# Patient Record
Sex: Male | Born: 1935 | Race: White | Hispanic: No | Smoking: Never smoker
Health system: Southern US, Community
[De-identification: ages and names within clinical notes are randomized; demographics above are authoritative.]

---

## 2007-04-29 ENCOUNTER — Emergency Department: Payer: Self-pay | Admitting: Emergency Medicine

## 2007-10-28 ENCOUNTER — Ambulatory Visit: Payer: Self-pay | Admitting: Unknown Physician Specialty

## 2007-11-05 ENCOUNTER — Ambulatory Visit: Payer: Self-pay | Admitting: Internal Medicine

## 2008-03-29 ENCOUNTER — Ambulatory Visit: Payer: Self-pay | Admitting: Unknown Physician Specialty

## 2008-07-31 ENCOUNTER — Ambulatory Visit: Payer: Self-pay | Admitting: Internal Medicine

## 2008-07-31 ENCOUNTER — Emergency Department: Payer: Self-pay | Admitting: Emergency Medicine

## 2008-08-08 ENCOUNTER — Encounter: Payer: Self-pay | Admitting: Internal Medicine

## 2008-08-31 ENCOUNTER — Encounter: Payer: Self-pay | Admitting: Internal Medicine

## 2014-08-30 ENCOUNTER — Emergency Department
Admission: EM | Admit: 2014-08-30 | Discharge: 2014-08-30 | Disposition: A | Discharge status: Home / Self Care | Payer: Medicare Other | Attending: Emergency Medicine | Admitting: Emergency Medicine

## 2014-08-30 ENCOUNTER — Encounter: Payer: Self-pay | Admitting: Medical Oncology

## 2014-08-30 ENCOUNTER — Emergency Department: Payer: Medicare Other

## 2014-08-30 DIAGNOSIS — Y939 Activity, unspecified: Secondary | ICD-10-CM | POA: Insufficient documentation

## 2014-08-30 DIAGNOSIS — S0083XA Contusion of other part of head, initial encounter: Secondary | ICD-10-CM

## 2014-08-30 DIAGNOSIS — W172XXA Fall into hole, initial encounter: Secondary | ICD-10-CM | POA: Diagnosis not present

## 2014-08-30 DIAGNOSIS — S0990XA Unspecified injury of head, initial encounter: Secondary | ICD-10-CM | POA: Diagnosis present

## 2014-08-30 DIAGNOSIS — Y998 Other external cause status: Secondary | ICD-10-CM | POA: Insufficient documentation

## 2014-08-30 DIAGNOSIS — S0031XA Abrasion of nose, initial encounter: Secondary | ICD-10-CM

## 2014-08-30 DIAGNOSIS — Y92009 Unspecified place in unspecified non-institutional (private) residence as the place of occurrence of the external cause: Secondary | ICD-10-CM | POA: Diagnosis not present

## 2014-08-30 DIAGNOSIS — S0181XA Laceration without foreign body of other part of head, initial encounter: Secondary | ICD-10-CM | POA: Diagnosis not present

## 2014-08-30 MED ORDER — TRAMADOL HCL 50 MG PO TABS: 50.0000 mg | ORAL_TABLET | Freq: Two times a day (BID) | ORAL | Status: DC

## 2014-08-30 MED ORDER — TRAMADOL HCL 50 MG PO TABS
50.0000 mg | ORAL_TABLET | Freq: Once | ORAL | Status: AC
  Administered 2014-08-30: 50 mg via ORAL

## 2014-08-30 MED ORDER — TRAMADOL HCL 50 MG PO TABS
ORAL_TABLET | ORAL | Status: AC
  Filled 2014-08-30: qty 1
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
## 2014-08-30 NOTE — ED Notes (Signed)
Pt from home via ems with reports of tripping in a hole on the gravel at home. Pt denies LOC, denies use of blood thinner. Alert and oriented, NAD noted. Pt has hematoma to forehead and lac below eye. Bleeding controlled.

## 2014-09-13 ENCOUNTER — Other Ambulatory Visit: Payer: Self-pay | Admitting: Internal Medicine

## 2014-09-13 DIAGNOSIS — R410 Disorientation, unspecified: Secondary | ICD-10-CM

## 2014-09-13 DIAGNOSIS — R270 Ataxia, unspecified: Secondary | ICD-10-CM

## 2014-09-14 ENCOUNTER — Ambulatory Visit: Admission: RE | Admit: 2014-09-14 | Payer: Medicare Other | Source: Ambulatory Visit

## 2014-09-15 ENCOUNTER — Ambulatory Visit
Admission: RE | Admit: 2014-09-15 | Discharge: 2014-09-15 | Disposition: A | Discharge status: Home / Self Care | Payer: Medicare Other | Source: Ambulatory Visit | Attending: Internal Medicine | Admitting: Internal Medicine

## 2014-09-15 DIAGNOSIS — D181 Lymphangioma, any site: Secondary | ICD-10-CM | POA: Diagnosis not present

## 2014-09-15 DIAGNOSIS — R270 Ataxia, unspecified: Secondary | ICD-10-CM

## 2014-09-15 DIAGNOSIS — R410 Disorientation, unspecified: Secondary | ICD-10-CM | POA: Diagnosis present

## 2015-10-31 ENCOUNTER — Encounter: Payer: Self-pay | Admitting: Emergency Medicine

## 2015-10-31 ENCOUNTER — Emergency Department
Admission: EM | Admit: 2015-10-31 | Discharge: 2015-10-31 | Disposition: A | Discharge status: Home / Self Care | Payer: No Typology Code available for payment source | Attending: Emergency Medicine | Admitting: Emergency Medicine

## 2015-10-31 DIAGNOSIS — Y92410 Unspecified street and highway as the place of occurrence of the external cause: Secondary | ICD-10-CM | POA: Insufficient documentation

## 2015-10-31 DIAGNOSIS — S51812A Laceration without foreign body of left forearm, initial encounter: Secondary | ICD-10-CM | POA: Diagnosis not present

## 2015-10-31 DIAGNOSIS — Z23 Encounter for immunization: Secondary | ICD-10-CM | POA: Insufficient documentation

## 2015-10-31 DIAGNOSIS — Y999 Unspecified external cause status: Secondary | ICD-10-CM | POA: Diagnosis not present

## 2015-10-31 DIAGNOSIS — Y9389 Activity, other specified: Secondary | ICD-10-CM | POA: Diagnosis not present

## 2015-10-31 DIAGNOSIS — S59912A Unspecified injury of left forearm, initial encounter: Secondary | ICD-10-CM | POA: Diagnosis present

## 2015-10-31 MED ORDER — TETANUS-DIPHTH-ACELL PERTUSSIS 5-2.5-18.5 LF-MCG/0.5 IM SUSP
0.5000 mL | Freq: Once | INTRAMUSCULAR | Status: AC
  Administered 2015-10-31: 0.5 mL via INTRAMUSCULAR
  Filled 2015-10-31: qty 0.5
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
## 2016-03-23 IMAGING — CT CT HEAD W/O CM
3 series · 16 of 30 positions shown, 17 images · non-contrast
Comparison: August 30, 2014

CLINICAL DATA: Increased confusion following fall 2.5 weeks prior

EXAM:
CT HEAD WITHOUT CONTRAST
TECHNIQUE: Contiguous axial images were obtained from the base of the skull
through the vertex without intravenous contrast.

[Series 2: soft tissue · axial · 0.44mm/px · z∈[+1286,+1376]mm · 4 of 31 slices shown, 5 images]
[im 7/31  brain]
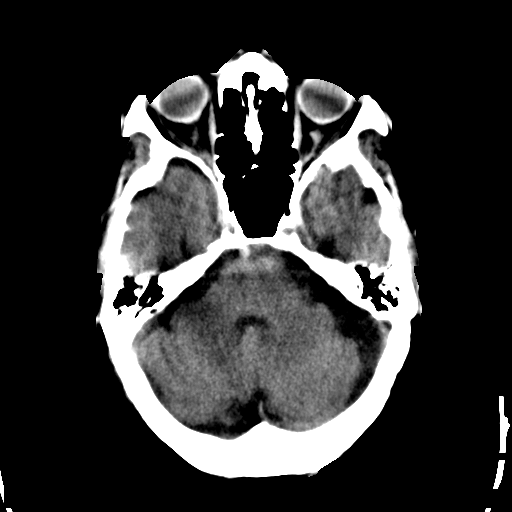
[im 7/31  bone]
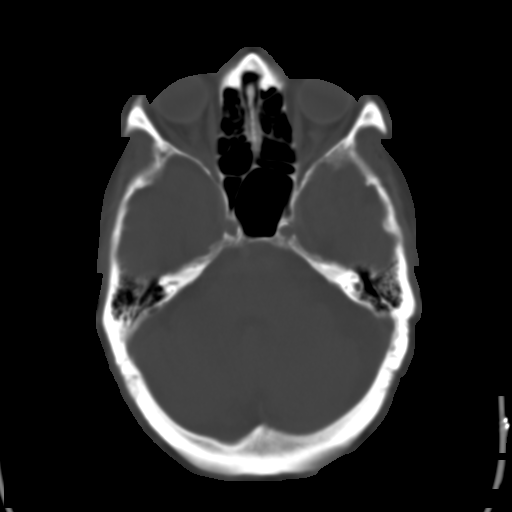
[im 13/31  brain]
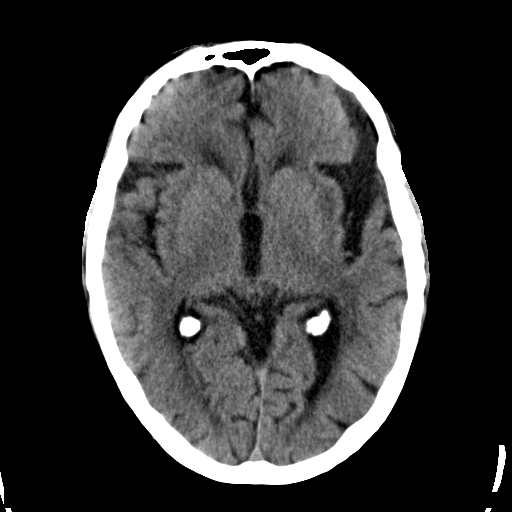
[im 19/31  brain]
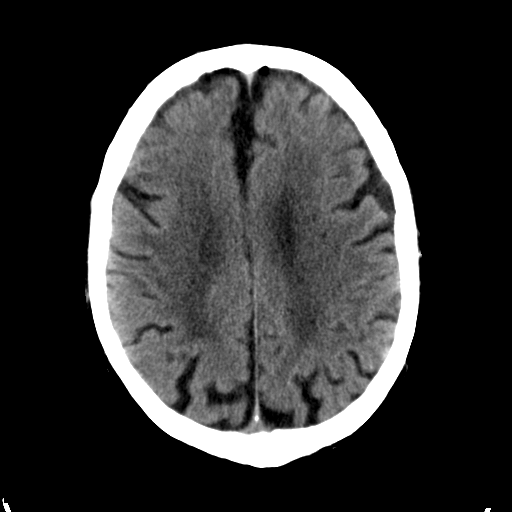
[im 25/31  brain]
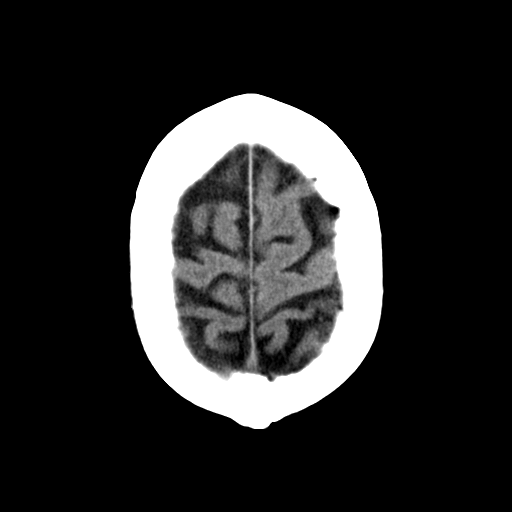

[Series 3: bone · axial · 0.44mm/px · z∈[+1265,+1405]mm · 8 of 87 slices shown]
[im 11/87  bone]
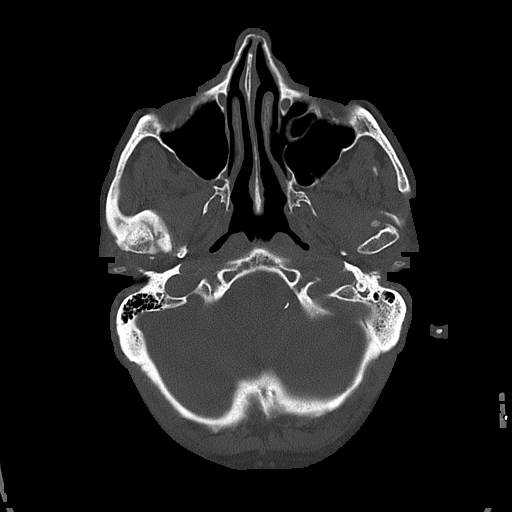
[im 21/87  bone]
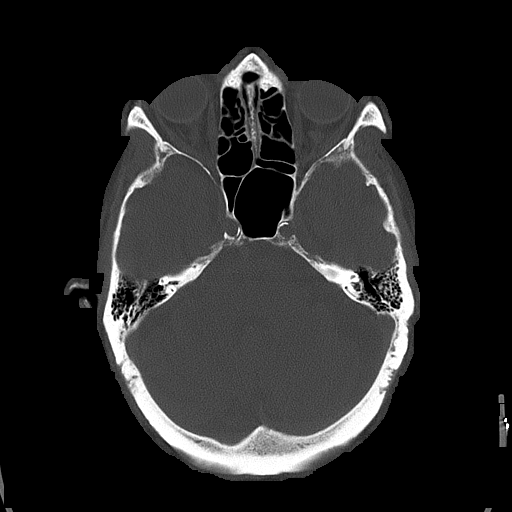
[im 31/87  bone]
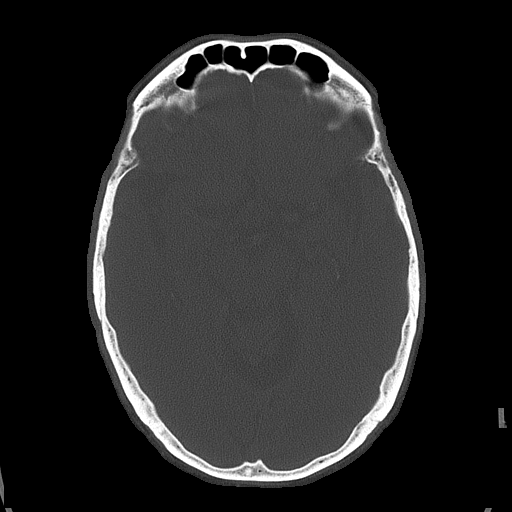
[im 41/87  bone]
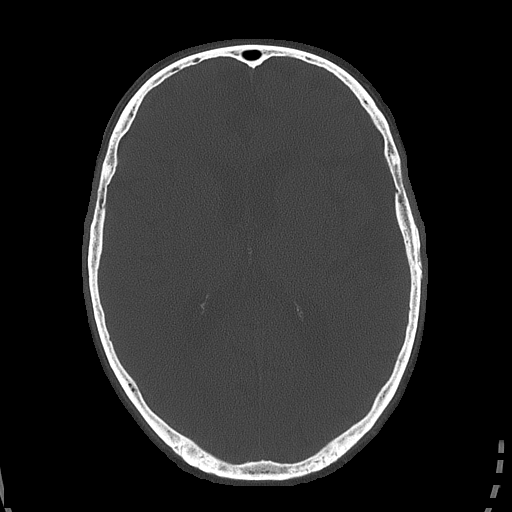
[im 51/87  bone]
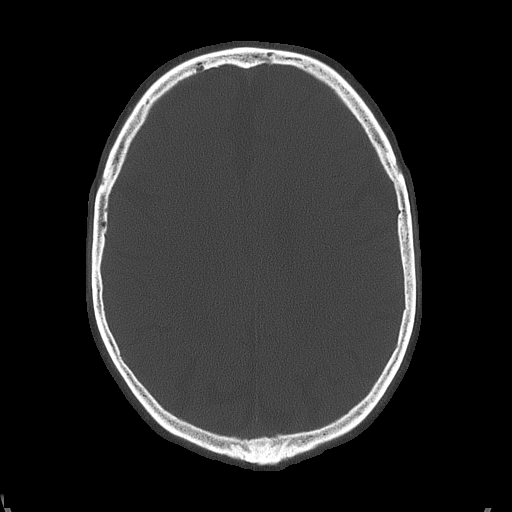
[im 61/87  bone]
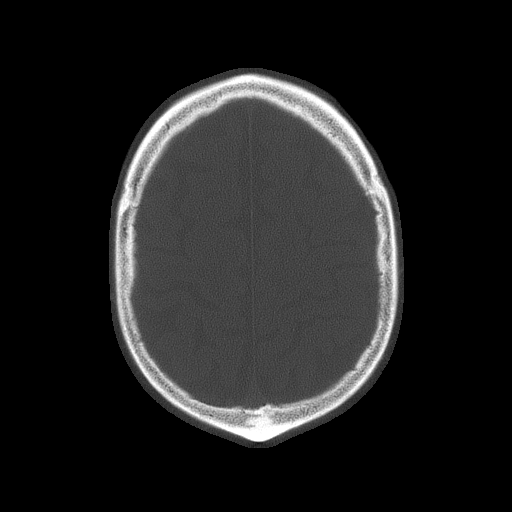
[im 71/87  bone]
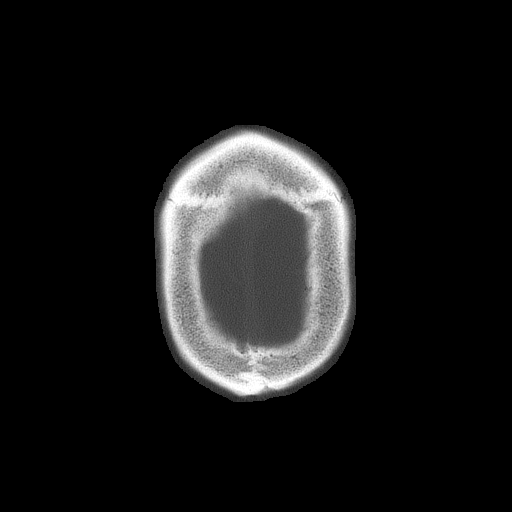
[im 81/87  bone]
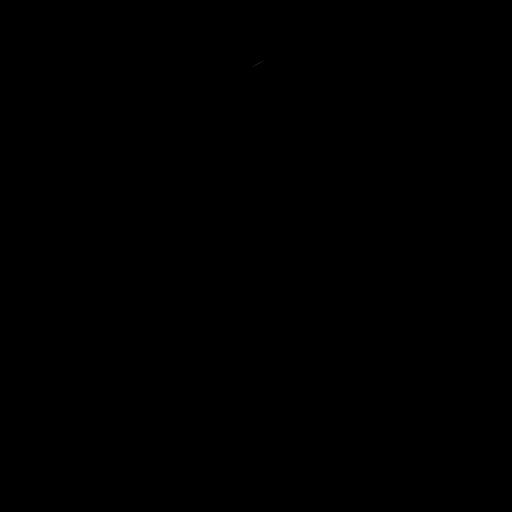

[Series 4: soft tissue recon · axial · 0.42mm/px · z∈[+1319,+1406]mm · 4 of 31 slices shown]
[im 7/31  brain]
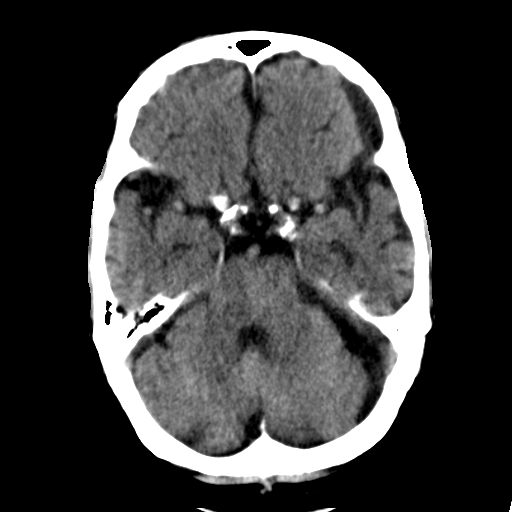
[im 13/31  brain]
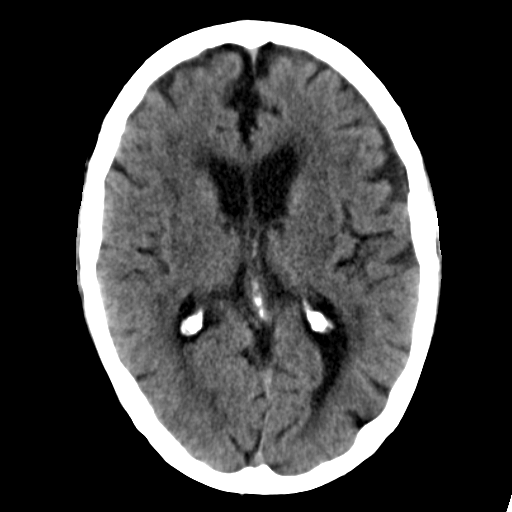
[im 19/31  brain]
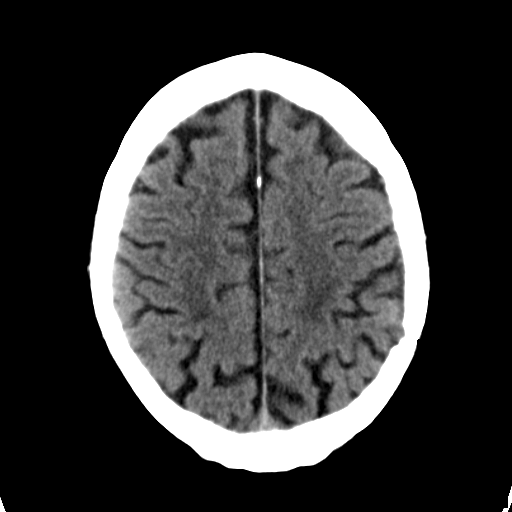
[im 25/31  brain]
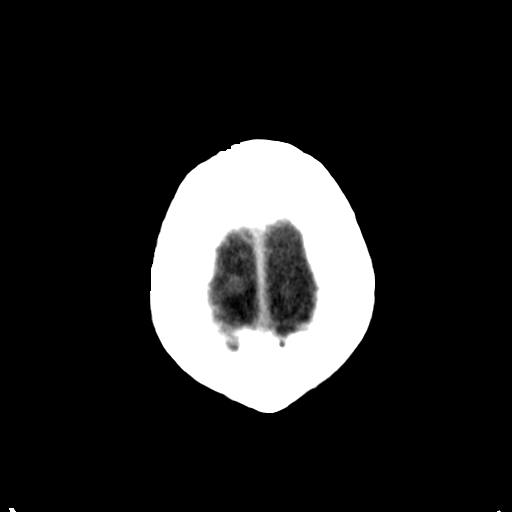

[16 of 30 positions shown; findings below may reference images not displayed]

FINDINGS: There is mild diffuse atrophy, stable. There is evidence of a
chronic subdural hygroma on the left involving the left frontal lobe
extending to the left sylvian fissure and anterior left temporal
lobe, stable. This subdural hygroma has a maximum thickness of 8 mm,
stable. There is localized impression on the brain parenchyma in
this area, stable. There is no edema or new mass effect. There is no
midline shift. There is no acute hemorrhage or evidence of mass on
this study. There is no new extra-axial fluid. There is patchy small
vessel disease throughout the centra semiovale bilaterally. Small
vessel disease is noted throughout the external capsules
bilaterally. There is no acute appearing infarct. No new gray-white
compartment lesions. There are burr holes on the left, stable. Bony
calvarium otherwise appears intact. Mastoid air cells are clear. The
right frontal scalp hematoma is considerably smaller.
IMPRESSION: Stable subdural hygroma on the left causing localized impression on
the underlying left frontal and anterior left temporal lobe regions
but no new mass effect or midline shift. No acute hemorrhage is
noted on this study. No new extra-axial fluid. There is mild atrophy
with extensive periventricular small vessel disease. There is also
small vessel disease in each external capsule. No acute infarct
apparent. The overall appearance is stable compared to recent prior
study, except that the right frontal scalp hematoma has become
considerably smaller since prior study.

## 2016-04-09 ENCOUNTER — Encounter: Payer: Self-pay | Admitting: Podiatry

## 2016-04-09 ENCOUNTER — Ambulatory Visit (INDEPENDENT_AMBULATORY_CARE_PROVIDER_SITE_OTHER): Payer: Medicare Other | Admitting: Podiatry

## 2016-04-09 VITALS — Resp 16

## 2016-04-09 DIAGNOSIS — M79676 Pain in unspecified toe(s): Secondary | ICD-10-CM | POA: Diagnosis not present

## 2016-04-09 DIAGNOSIS — B351 Tinea unguium: Secondary | ICD-10-CM

## 2016-04-09 DIAGNOSIS — Q828 Other specified congenital malformations of skin: Secondary | ICD-10-CM

## 2016-04-09 NOTE — Progress Notes (Signed)
   Subjective:    Patient ID: Danny James, male    DOB: [birth year], 83 y.o.   MRN: [id number]  HPI: He presents today with chief complaint of thick painful fungal nails and also painful lesion to the distal aspect of the third digit of the left foot. He says been like this for many years utilizes pads between his toes because of the severe deformities. He states that that makes them feel better. He tried cutting his nails himself particularly with a double-action nail clipper and he states that he is still unable to do it.    Review of Systems  All other systems reviewed and are negative.      Objective:   Physical Exam: Vital signs are stable alert and oriented 3. Pulses are palpable. Neurologic sensorium is intact. Deep tendon reflexes are intact muscle strength is normal. Orthopedic evaluation does demonstrate severe hammertoe deformities and hallux valgus deformities with overlapping and under lapping digits. Toenails are thick yellow dystrophic with mycotic painful palpation as well as debridement. They're grossly elongated. No ulcerations are noted. He does have distal clavus to the third digit of the left foot which does not demonstrate any signs of infection.      Assessment & Plan:  Assessment: Hammertoe deformities severe osteoarthritic changes bilateral foot hallux valgus deformities bilateral. Pain in limb secondary to onychomycosis and Salter porokeratotic lesion distal aspect third digit left foot.  Plan: Debrided all nails 1 through 5 bilaterally covered service secondary to pain to redirect hyperkeratotic tissue secondary to pain no iatrogenic lesions were identified.

## 2016-08-13 ENCOUNTER — Ambulatory Visit (INDEPENDENT_AMBULATORY_CARE_PROVIDER_SITE_OTHER): Payer: Medicare Other | Admitting: Podiatry

## 2016-08-13 ENCOUNTER — Encounter: Payer: Self-pay | Admitting: Podiatry

## 2016-08-13 DIAGNOSIS — B351 Tinea unguium: Secondary | ICD-10-CM

## 2016-08-13 DIAGNOSIS — Q828 Other specified congenital malformations of skin: Secondary | ICD-10-CM

## 2016-08-13 DIAGNOSIS — M79676 Pain in unspecified toe(s): Secondary | ICD-10-CM | POA: Diagnosis not present

## 2016-08-13 NOTE — Progress Notes (Signed)
He presents today to complaint of painful elongated toenails. Painful callus third toe left foot has been applying an acid pad.  Objective: Pulses are palpable nails are long thickened dystrophic with mycotic pulse remained palpable no open lesions or wounds. Painful lesion third digit of the left foot distal clavus. No open lesions or wounds are noted at this area.  Assessment: Pain and limps onychomycosis. Pain in limb secondary to porokeratosis and callus.  Plan: Debridement toenails. Debridement of porokeratosis.

## 2017-04-30 ENCOUNTER — Other Ambulatory Visit: Payer: Self-pay | Admitting: Internal Medicine

## 2017-04-30 DIAGNOSIS — F4489 Other dissociative and conversion disorders: Secondary | ICD-10-CM

## 2017-05-04 ENCOUNTER — Ambulatory Visit
Admission: RE | Admit: 2017-05-04 | Discharge: 2017-05-04 | Disposition: A | Discharge status: Home / Self Care | Payer: Medicare Other | Source: Ambulatory Visit | Attending: Internal Medicine | Admitting: Internal Medicine

## 2017-05-04 DIAGNOSIS — I6200 Nontraumatic subdural hemorrhage, unspecified: Secondary | ICD-10-CM | POA: Diagnosis not present

## 2017-05-04 DIAGNOSIS — F4489 Other dissociative and conversion disorders: Secondary | ICD-10-CM | POA: Insufficient documentation

## 2017-09-07 ENCOUNTER — Other Ambulatory Visit: Payer: Self-pay

## 2017-09-07 ENCOUNTER — Emergency Department: Payer: Medicare Other

## 2017-09-07 ENCOUNTER — Observation Stay
Admission: EM | Admit: 2017-09-07 | Discharge: 2017-09-11 | Disposition: A | Discharge status: Skilled Nursing Facility | Payer: Medicare Other | Attending: Internal Medicine | Admitting: Internal Medicine

## 2017-09-07 DIAGNOSIS — S41111A Laceration without foreign body of right upper arm, initial encounter: Secondary | ICD-10-CM | POA: Diagnosis not present

## 2017-09-07 DIAGNOSIS — M4854XA Collapsed vertebra, not elsewhere classified, thoracic region, initial encounter for fracture: Secondary | ICD-10-CM | POA: Diagnosis not present

## 2017-09-07 DIAGNOSIS — Y9301 Activity, walking, marching and hiking: Secondary | ICD-10-CM | POA: Diagnosis not present

## 2017-09-07 DIAGNOSIS — Z9181 History of falling: Secondary | ICD-10-CM | POA: Diagnosis not present

## 2017-09-07 DIAGNOSIS — T148XXA Other injury of unspecified body region, initial encounter: Secondary | ICD-10-CM | POA: Diagnosis present

## 2017-09-07 DIAGNOSIS — E876 Hypokalemia: Secondary | ICD-10-CM | POA: Insufficient documentation

## 2017-09-07 DIAGNOSIS — Z79899 Other long term (current) drug therapy: Secondary | ICD-10-CM | POA: Insufficient documentation

## 2017-09-07 DIAGNOSIS — Y9389 Activity, other specified: Secondary | ICD-10-CM | POA: Diagnosis not present

## 2017-09-07 DIAGNOSIS — I700 Atherosclerosis of aorta: Secondary | ICD-10-CM | POA: Insufficient documentation

## 2017-09-07 DIAGNOSIS — S0003XA Contusion of scalp, initial encounter: Secondary | ICD-10-CM | POA: Diagnosis present

## 2017-09-07 DIAGNOSIS — R159 Full incontinence of feces: Secondary | ICD-10-CM | POA: Insufficient documentation

## 2017-09-07 DIAGNOSIS — R4182 Altered mental status, unspecified: Principal | ICD-10-CM | POA: Diagnosis present

## 2017-09-07 DIAGNOSIS — R296 Repeated falls: Secondary | ICD-10-CM | POA: Insufficient documentation

## 2017-09-07 DIAGNOSIS — D649 Anemia, unspecified: Secondary | ICD-10-CM | POA: Insufficient documentation

## 2017-09-07 DIAGNOSIS — F0390 Unspecified dementia without behavioral disturbance: Secondary | ICD-10-CM | POA: Insufficient documentation

## 2017-09-07 DIAGNOSIS — W0110XA Fall on same level from slipping, tripping and stumbling with subsequent striking against unspecified object, initial encounter: Secondary | ICD-10-CM | POA: Diagnosis not present

## 2017-09-07 DIAGNOSIS — W19XXXA Unspecified fall, initial encounter: Secondary | ICD-10-CM

## 2017-09-07 DIAGNOSIS — Z86010 Personal history of colonic polyps: Secondary | ICD-10-CM | POA: Diagnosis not present

## 2017-09-07 DIAGNOSIS — R443 Hallucinations, unspecified: Secondary | ICD-10-CM | POA: Insufficient documentation

## 2017-09-07 DIAGNOSIS — R4781 Slurred speech: Secondary | ICD-10-CM | POA: Diagnosis not present

## 2017-09-07 DIAGNOSIS — F0781 Postconcussional syndrome: Secondary | ICD-10-CM | POA: Diagnosis not present

## 2017-09-07 DIAGNOSIS — N281 Cyst of kidney, acquired: Secondary | ICD-10-CM | POA: Diagnosis not present

## 2017-09-07 DIAGNOSIS — W1839XA Other fall on same level, initial encounter: Secondary | ICD-10-CM | POA: Insufficient documentation

## 2017-09-07 DIAGNOSIS — I252 Old myocardial infarction: Secondary | ICD-10-CM | POA: Diagnosis not present

## 2017-09-07 DIAGNOSIS — S51812A Laceration without foreign body of left forearm, initial encounter: Secondary | ICD-10-CM | POA: Insufficient documentation

## 2017-09-07 DIAGNOSIS — E860 Dehydration: Secondary | ICD-10-CM | POA: Diagnosis not present

## 2017-09-07 DIAGNOSIS — R531 Weakness: Secondary | ICD-10-CM | POA: Insufficient documentation

## 2017-09-07 DIAGNOSIS — Z85038 Personal history of other malignant neoplasm of large intestine: Secondary | ICD-10-CM | POA: Insufficient documentation

## 2017-09-07 LAB — CBC
HCT: 35.6 % — ABNORMAL LOW (ref 40.0–52.0)
Hemoglobin: 12.4 g/dL — ABNORMAL LOW (ref 13.0–18.0)
MCH: 32.6 pg (ref 26.0–34.0)
MCHC: 34.8 g/dL (ref 32.0–36.0)
MCV: 93.7 fL (ref 80.0–100.0)
Platelets: 181 10*3/uL (ref 150–440)
RBC: 3.8 MIL/uL — AB (ref 4.40–5.90)
RDW: 15.3 % — ABNORMAL HIGH (ref 11.5–14.5)
WBC: 6.9 10*3/uL (ref 3.8–10.6)

## 2017-09-07 LAB — COMPREHENSIVE METABOLIC PANEL
ALT: 17 U/L (ref 0–44)
AST: 33 U/L (ref 15–41)
Albumin: 3.5 g/dL (ref 3.5–5.0)
Alkaline Phosphatase: 53 U/L (ref 38–126)
Anion gap: 7 (ref 5–15)
BILIRUBIN TOTAL: 1.2 mg/dL (ref 0.3–1.2)
BUN: 19 mg/dL (ref 8–23)
CALCIUM: 8.3 mg/dL — AB (ref 8.9–10.3)
CO2: 23 mmol/L (ref 22–32)
Chloride: 110 mmol/L (ref 98–111)
Creatinine, Ser: 0.74 mg/dL (ref 0.61–1.24)
Glucose, Bld: 87 mg/dL (ref 70–99)
Potassium: 3.4 mmol/L — ABNORMAL LOW (ref 3.5–5.1)
SODIUM: 140 mmol/L (ref 135–145)
Total Protein: 6 g/dL — ABNORMAL LOW (ref 6.5–8.1)

## 2017-09-07 LAB — AMMONIA: Ammonia: 16 umol/L (ref 9–35)

## 2017-09-07 LAB — TROPONIN I: Troponin I: <0.03 ng/mL (ref <0.03)

## 2017-09-07 LAB — CK: Total CK: 473 U/L — ABNORMAL HIGH (ref 49–397)

## 2017-09-07 MED ORDER — SODIUM CHLORIDE 0.9 % IV BOLUS
1000.0000 mL | Freq: Once | INTRAVENOUS | Status: AC
  Administered 2017-09-07: 1000 mL via INTRAVENOUS

## 2017-09-07 MED ORDER — IOHEXOL 300 MG/ML  SOLN
100.0000 mL | Freq: Once | INTRAMUSCULAR | Status: AC | PRN
  Administered 2017-09-07: 100 mL via INTRAVENOUS

## 2017-09-07 MED ORDER — TETANUS-DIPHTH-ACELL PERTUSSIS 5-2.5-18.5 LF-MCG/0.5 IM SUSP
0.5000 mL | Freq: Once | INTRAMUSCULAR | Status: AC
  Administered 2017-09-07: 0.5 mL via INTRAMUSCULAR
  Filled 2017-09-07: qty 0.5

## 2017-09-07 NOTE — ED Provider Notes (Signed)
[facility] Emergency Department Provider Note  ____________________________________________  Time seen: Approximately 9:54 PM  I have reviewed the triage vital signs and the nursing notes.   HISTORY  Chief Complaint Altered Mental Status    HPI Danny James is a 84 y.o. male, not anticoagulated, presenting for altered mental status.  The patient was last seen in his usual state of health July 4.  At this time, the patient is altered and unable to give peer and he is accompanied by his son and daughter-in-law who state that they called him on the phone today and he was not answering questions appropriately.  Upon arrival, they noted a large contusion to the right forehead with dried blood and altered mental status.  The patient denies any pain.  He states that he fell 5 times yesterday because "it was dark I could not see anything" but is unable to give any additional history.  History reviewed. No pertinent past medical history.  There are no active problems to display for this patient.   History reviewed. No pertinent surgical history.    Allergies Patient has no known allergies.  No family history on file.  Social History Social History   Tobacco Use  . Smoking status: Never Smoker  . Smokeless tobacco: Never Used  Substance Use Topics  . Alcohol use: No  . Drug use: No    Review of Systems Unable to obtain due to altered mental status.  ____________________________________________   PHYSICAL EXAM:  VITAL SIGNS: ED Triage Vitals  Enc Vitals Group     BP 09/07/17 2052 122/76     Pulse Rate 09/07/17 2052 86     Resp 09/07/17 2052 18     Temp 09/07/17 2052 98.3 F (36.8 C)     Temp Source 09/07/17 2052 Oral     SpO2 09/07/17 2052 100 %     Weight 09/07/17 2045 180 lb 12.4 oz (82 kg)     Height 09/07/17 2045 6' (1.829 m)     Head Circumference --      Peak Flow --      Pain Score --      Pain Loc --      Pain Edu? --      Excl.  in [place]? --     Constitutional: The patient is alert to person and place but does not know the year or month.  GCS is 15.   Eyes: Conjunctivae are normal.  EOMI. PERRLA no scleral icterus.  No raccoon eyes. Head: The patient has a 2 x 2 inch contusion on the right scalp with overlying ecchymosis and dried blood; it is hemostatic.  No battle sign. EARS: No hemotympanum on the right; most of the TM is obscured on the left from cerumen but there is no evidence of hemotympanum from what I can see. Nose: No congestion/rhinnorhea.  No swelling over the nose or septal hematoma. Mouth/Throat: Mucous membranes are dry.  No dental injury or malocclusion..  Neck: No stridor.  Supple.  No midline C-spine tenderness to palpation, step-offs or deformities. Cardiovascular: Normal rate, regular rhythm. No murmurs, rubs or gallops.  Respiratory: Normal respiratory effort.  No accessory muscle use or retractions. Lungs CTAB.  No wheezes, rales or ronchi. Gastrointestinal: Soft, nontender and nondistended.  No guarding or rebound.  No peritoneal signs. Musculoskeletal: Pelvis is stable.  The patient has full range of motion of the bilateral hips, knees, and ankles without pain.  No LE edema. No ttp  in the calves or palpable cords.  Negative Homan's sign. Neurologic:  A&Ox2.  Speech intermittently slurred and occasionally nonsensical he does not answer questions a properly..  Face and smile are symmetric.  EOMI. pupils are 2 mm and equal bilaterally.  5 out of 5 grip strength, biceps, triceps strength, hip flexors, dorsiflexion and plantar flexion.  Normal sensation to light touch in the lower extremities and upper extremities, face. Skin:  Skin is warm, dry.  In addition to the scalp contusion, the patient also has a linear 3 inch skin tear over the distal left forearm and a 2 inch skin tear over the right arm Psychiatric: Mood and affect are normal.  ____________________________________________   LABS (all labs  ordered are listed, but only abnormal results are displayed)  Labs Reviewed  COMPREHENSIVE METABOLIC PANEL - Abnormal; Notable for the following components:      Result Value   Potassium 3.4 (*)    Calcium 8.3 (*)    Total Protein 6.0 (*)    All other components within normal limits  CBC - Abnormal; Notable for the following components:   RBC 3.80 (*)    Hemoglobin 12.4 (*)    HCT 35.6 (*)    RDW 15.3 (*)    All other components within normal limits  CK - Abnormal; Notable for the following components:   Total CK 473 (*)    All other components within normal limits  TROPONIN I  AMMONIA  CBG MONITORING, ED   ____________________________________________  EKG  ED ECG REPORT I, Eula Listen, the attending physician, personally viewed and interpreted this ECG.   Date: 09/07/2017  EKG Time: 2046  Rate: 85  Rhythm: normal sinus rhythm  Axis: normal  Intervals:none  ST&T Change: No STEMI  ____________________________________________  RADIOLOGY  Dg Chest 2 View  Result Date: 09/07/2017 CLINICAL DATA:  Altered mental status, fever and possible urinary tract infection. EXAM: CHEST - 2 VIEW COMPARISON:  CXR 07/31/2008 FINDINGS: Colonic interposition over the liver shadow with elevated right hemidiaphragm is noted. There is bibasilar subsegmental atelectasis with low lung volumes. No overt pulmonary edema. Posterior costophrenic angle pulmonary opacities likely reflect atelectatic change. Superimposed pneumonia would be difficult to entirely exclude as seen on the lateral view. No acute osseous appearing abnormality. Tortuous atherosclerotic aorta. Heart size is within normal limits. IMPRESSION: 1. Elevated right hemidiaphragm with colonic interposition over the liver shadow. 2. Bibasilar atelectasis. Superimposed pneumonia projecting over the posterior costophrenic angle is not excluded given somewhat more confluent opacity seen on the lateral view. 3. Aortic atherosclerosis  without aneurysm. Electronically Signed   By: Ashley Royalty M.D.   On: 09/07/2017 21:24    ____________________________________________   PROCEDURES  Procedure(s) performed: None  Procedures  Critical Care performed: No ____________________________________________   INITIAL IMPRESSION / ASSESSMENT AND PLAN / ED COURSE  Pertinent labs & imaging results that were available during my care of the patient were reviewed by me and considered in my medical decision making (see chart for details).  84 y.o. male brought by his family for altered mental status with obvious signs of fall.  We will do a full trauma evaluation with CT of the head and neck.  He does have some skin tears and I will give him a Tdap for that.  The cause of the patient's fall is unclear and it is not known whether he had syncopal episodes.  At this time, his EKG does not show ischemic changes or arrhythmia.  His troponin is also  negative.  His blood counts are reassuring; he is not severely anemic.  His electrolytes are reassuring and he is not in renal failure; his hepatic function panel is also normal.  UA is pending, and I have added a CK as well.  The patient's chest x-ray does show an elevated right hemidiaphragm with colonic interposition so CT of the chest and abdomen have been ordered.  There is also some bibasilar atelectasis and a better view will be obtained with a CT which will help determine the course for antibiotic administration.  Plan admission.  ----------------------------------------- 11:35 PM on 09/07/2017 -----------------------------------------  The patient is continued to be hemodynamically stable.  His electrolytes are reassuring.  His ammonia level is 16.  His CK is mildly elevated at 473 but his creatinine is reassuring; he is receiving intravenous fluids.  The patient's CT head, C-spine, chest and abdomen and pelvis are pending and have been signed out to Dr. Charlesetta Ivory for final  disposition.  ____________________________________________  FINAL CLINICAL IMPRESSION(S) / ED DIAGNOSES  Final diagnoses:  Altered mental status, unspecified altered mental status type  Fall, initial encounter  Contusion of left temporofrontal scalp, initial encounter  Multiple skin tears         NEW MEDICATIONS STARTED DURING THIS VISIT:  New Prescriptions   No medications on file      Eula Listen, MD 09/07/17 2337

## 2017-09-07 NOTE — ED Triage Notes (Addendum)
Pt arrives to ED via [facility] from home with c/o AMS, fever, and possible UTI. Per EMS, pt had an axillary temp of 100.2, was confused, and has injuries from a fall in the last few days that was untreated. Pt has a laceration to the right side of the head and bilateral skin tears on the elbows. EMS states pt meets Code Sepsis protocols; 18G PIV in the right forearm placed with 511mL NS infused PTA. Pt is alert but confused; family at bedside. 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

## 2017-09-07 NOTE — ED Notes (Signed)
Patient transported to X-ray 

## 2017-09-08 ENCOUNTER — Other Ambulatory Visit: Payer: Self-pay

## 2017-09-08 ENCOUNTER — Encounter: Payer: Self-pay | Admitting: Internal Medicine

## 2017-09-08 ENCOUNTER — Observation Stay: Payer: Medicare Other

## 2017-09-08 DIAGNOSIS — R4182 Altered mental status, unspecified: Secondary | ICD-10-CM | POA: Diagnosis not present

## 2017-09-08 LAB — URINALYSIS, COMPLETE (UACMP) WITH MICROSCOPIC
Bacteria, UA: NONE SEEN
Bilirubin Urine: NEGATIVE
GLUCOSE, UA: NEGATIVE mg/dL
HGB URINE DIPSTICK: NEGATIVE
KETONES UR: 20 mg/dL — AB
LEUKOCYTES UA: NEGATIVE
NITRITE: NEGATIVE
Protein, ur: NEGATIVE mg/dL
SQUAMOUS EPITHELIAL / LPF: NONE SEEN (ref 0–5)
Specific Gravity, Urine: 1.026 (ref 1.005–1.030)
pH: 5 (ref 5.0–8.0)

## 2017-09-08 LAB — URINE DRUG SCREEN, QUALITATIVE (ARMC ONLY)
Amphetamines, Ur Screen: NOT DETECTED
BENZODIAZEPINE, UR SCRN: NOT DETECTED
CANNABINOID 50 NG, UR ~~LOC~~: NOT DETECTED
Cocaine Metabolite,Ur ~~LOC~~: NOT DETECTED
MDMA (Ecstasy)Ur Screen: NOT DETECTED
Methadone Scn, Ur: NOT DETECTED
Opiate, Ur Screen: NOT DETECTED
PHENCYCLIDINE (PCP) UR S: NOT DETECTED
Tricyclic, Ur Screen: NOT DETECTED

## 2017-09-08 LAB — BASIC METABOLIC PANEL
Anion gap: 9 (ref 5–15)
BUN: 15 mg/dL (ref 8–23)
CO2: 22 mmol/L (ref 22–32)
Calcium: 8.1 mg/dL — ABNORMAL LOW (ref 8.9–10.3)
Chloride: 109 mmol/L (ref 98–111)
Creatinine, Ser: 0.79 mg/dL (ref 0.61–1.24)
GFR calc Af Amer: >60 mL/min (ref >60)
GLUCOSE: 80 mg/dL (ref 70–99)
Potassium: 3.3 mmol/L — ABNORMAL LOW (ref 3.5–5.1)
Sodium: 140 mmol/L (ref 135–145)

## 2017-09-08 LAB — PHOSPHORUS: Phosphorus: 3 mg/dL (ref 2.5–4.6)

## 2017-09-08 LAB — FOLATE: FOLATE: 12.3 ng/mL (ref >5.9)

## 2017-09-08 LAB — TSH: TSH: 1.365 u[IU]/mL (ref 0.350–4.500)

## 2017-09-08 LAB — VITAMIN B12: Vitamin B-12: 217 pg/mL (ref 180–914)

## 2017-09-08 LAB — MAGNESIUM: MAGNESIUM: 1.9 mg/dL (ref 1.7–2.4)

## 2017-09-08 MED ORDER — ACETAMINOPHEN 325 MG PO TABS: 650.0000 mg | ORAL_TABLET | Freq: Four times a day (QID) | ORAL | Status: DC | PRN

## 2017-09-08 MED ORDER — ONDANSETRON HCL 4 MG/2ML IJ SOLN: 4.0000 mg | Freq: Four times a day (QID) | INTRAMUSCULAR | Status: DC | PRN

## 2017-09-08 MED ORDER — ACETAMINOPHEN 650 MG RE SUPP: 650.0000 mg | Freq: Four times a day (QID) | RECTAL | Status: DC | PRN

## 2017-09-08 MED ORDER — HALOPERIDOL LACTATE 5 MG/ML IJ SOLN
2.5000 mg | Freq: Four times a day (QID) | INTRAMUSCULAR | Status: DC | PRN
  Administered 2017-09-08 (×2): 2.5 mg via INTRAVENOUS
  Filled 2017-09-08 (×3): qty 0.5

## 2017-09-08 MED ORDER — ENOXAPARIN SODIUM 40 MG/0.4ML ~~LOC~~ SOLN
40.0000 mg | SUBCUTANEOUS | Status: DC
  Administered 2017-09-08 – 2017-09-10 (×3): 40 mg via SUBCUTANEOUS
  Filled 2017-09-08 (×3): qty 0.4

## 2017-09-08 MED ORDER — POTASSIUM CHLORIDE CRYS ER 20 MEQ PO TBCR
40.0000 meq | EXTENDED_RELEASE_TABLET | Freq: Once | ORAL | Status: AC
  Administered 2017-09-08: 40 meq via ORAL
  Filled 2017-09-08: qty 2

## 2017-09-08 MED ORDER — BISACODYL 5 MG PO TBEC: 5.0000 mg | DELAYED_RELEASE_TABLET | Freq: Every day | ORAL | Status: DC | PRN

## 2017-09-08 MED ORDER — SODIUM CHLORIDE 0.9 % IV SOLN
INTRAVENOUS | Status: AC
  Administered 2017-09-08 – 2017-09-09 (×2): via INTRAVENOUS

## 2017-09-08 MED ORDER — LACTATED RINGERS IV SOLN: INTRAVENOUS | Status: AC

## 2017-09-08 MED ORDER — ONDANSETRON HCL 4 MG PO TABS: 4.0000 mg | ORAL_TABLET | Freq: Four times a day (QID) | ORAL | Status: DC | PRN

## 2017-09-08 MED ORDER — SENNOSIDES-DOCUSATE SODIUM 8.6-50 MG PO TABS: 1.0000 | ORAL_TABLET | Freq: Every evening | ORAL | Status: DC | PRN

## 2017-09-08 NOTE — Care Management Obs Status (Signed)
[facility] NOTIFICATION   Patient Details  Name: SYMEON PULEO MRN: [id number] Date of Birth: December 08, 1920   Medicare Observation Status Notification Given:  Yes    Beverly Sessions, RN 09/08/2017, 4:53 PM

## 2017-09-08 NOTE — ED Notes (Signed)
Patient transported to 225 

## 2017-09-08 NOTE — Consult Note (Signed)
Reason for Consult:AMS Referring Physician: Verdell Carmine  CC: AMS  HPI: Danny James is an 84 y.o. male who is unable to provide any history.  All history obtained from the son.   His son tells me that he saw the pt on 07/04, at which time pt was in his usual state of health. He states he spoke to pt on the phone on 07/08, and noticed pt was incoherent, "talking out of his head"; son drove over, but pt was too weak to come to door; son broke door down, found pt sitting/slouched in chair unable to get up. EMS was called at that time.  Patient has not returned to baseline mental status.   At baseline the patient lives alone. His sons visit the pt at home and do groceries weekly, as well as call him daily. They have been trying to get him to move into assisted living for the past year, but he has been resistant to doing so. They feel he is unsafe at home by himself. He has reportedly been falling, but he does not tell his family when he falls. Gets name of family members wrong at times and does have hallucinations.     Past medical history: SDH, fecal incontinence, dementia, colon cancer stage I  Surgical history: Colon polypectomy, craniotomy for SDH  Family history: Twin sons alive and well  Social History:  reports that he has never smoked. He has never used smokeless tobacco. He reports that he does not drink alcohol or use drugs.  No Known Allergies  Medications:  I have reviewed the patient's current medications. Prior to Admission:  No medications prior to admission.   Scheduled: . enoxaparin (LOVENOX) injection  40 mg Subcutaneous Q24H    ROS: History obtained from son  General ROS: negative for - chills, fatigue, fever, night sweats, weight gain or weight loss Psychological ROS:  hallucinations, memory difficulties Ophthalmic ROS: negative for - blurry vision, double vision, eye pain or loss of vision ENT ROS: HOH Allergy and Immunology ROS: negative for - hives or itchy/watery  eyes Hematological and Lymphatic ROS: negative for - bleeding problems, bruising or swollen lymph nodes Endocrine ROS: negative for - galactorrhea, hair pattern changes, polydipsia/polyuria or temperature intolerance Respiratory ROS: negative for - cough, hemoptysis, shortness of breath or wheezing Cardiovascular ROS: negative for - chest pain, dyspnea on exertion, edema or irregular heartbeat Gastrointestinal ROS: negative for - abdominal pain, diarrhea, hematemesis, nausea/vomiting or stool incontinence Genito-Urinary ROS: negative for - dysuria, hematuria, incontinence or urinary frequency/urgency Musculoskeletal ROS: negative for - joint swelling or muscular weakness Neurological ROS: as noted in HPI Dermatological ROS: negative for rash and skin lesion changes  Physical Examination: Blood pressure 121/81, pulse 74, temperature 98.7 F (37.1 C), temperature source Oral, resp. rate 15, height 6' (1.829 m), weight 82 kg (180 lb 12.4 oz), SpO2 94 %.  HEENT-  Right scalp hematoma.  Normal external eye and conjunctiva.  Normal TM's bilaterally.  Normal auditory canals and external ears. Normal external nose, mucus membranes and septum.  Normal pharynx. Cardiovascular- S1, S2 normal, pulses palpable throughout   Lungs- chest clear, no wheezing, rales, normal symmetric air entry Abdomen- soft, non-tender; bowel sounds normal; no masses,  no organomegaly Extremities- no edema Lymph-no adenopathy palpable Musculoskeletal-no joint tenderness, deformity or swelling Skin-multiple areas of bruising on the lower extremities  Neurological Examination  Mental Status: Alert.  Perseverates.  Unable to give the name of family members in the room.  Reports that a small  child has been in the room a lot.  Unable to give the date or tell where he is.  Follows simple commands but requires extensive reinforcement to follow 3 step commandsy. Speech fluent but slurred Cranial Nerves: II: Discs flat bilaterally;  Visual fields grossly normal III,IV, VI: ptosis not present, extra-ocular motions intact bilaterally V,VII: smile symmetric, facial light touch sensation normal bilaterally VIII: hearing normal bilaterally IX,X: gag reflex present XI: bilateral shoulder shrug XII: midline tongue extension Motor: Patient able t lift all extremities off the bed with the arms being stronger than the legs.   Sensory: Pinprick and light touch intact throughout, bilaterally Deep Tendon Reflexes: 2+ and symmetric with absent AJ's bilaterally Plantars: Right: upgoing   Left: upgoing Cerebellar: Normal finger-to-nose testing bilaterally.  Gait: not tested due to safety concerns    Laboratory Studies:   Basic Metabolic Panel: Recent Labs  Lab 09/07/17 2057 09/08/17 0126 09/08/17 0526  NA 140  --  140  K 3.4*  --  3.3*  CL 110  --  109  CO2 23  --  22  GLUCOSE 87  --  80  BUN 19  --  15  CREATININE 0.74  --  0.79  CALCIUM 8.3*  --  8.1*  MG  --  1.9  --   PHOS  --  3.0  --     Liver Function Tests: Recent Labs  Lab 09/07/17 2057  AST 33  ALT 17  ALKPHOS 53  BILITOT 1.2  PROT 6.0*  ALBUMIN 3.5   No results for input(s): LIPASE, AMYLASE in the last 168 hours. Recent Labs  Lab 09/07/17 2203  AMMONIA 16    CBC: Recent Labs  Lab 09/07/17 2057  WBC 6.9  HGB 12.4*  HCT 35.6*  MCV 93.7  PLT 181    Cardiac Enzymes: Recent Labs  Lab 09/07/17 2057  CKTOTAL 473*  TROPONINI <0.03    BNP: Invalid input(s): POCBNP  CBG: No results for input(s): GLUCAP in the last 168 hours.  Microbiology: No results found for this or any previous visit.  Coagulation Studies: No results for input(s): LABPROT, INR in the last 72 hours.  Urinalysis:  Recent Labs  Lab 09/08/17 0126  COLORURINE YELLOW*  LABSPEC 1.026  PHURINE 5.0  GLUCOSEU NEGATIVE  HGBUR NEGATIVE  BILIRUBINUR NEGATIVE  KETONESUR 20*  PROTEINUR NEGATIVE  NITRITE NEGATIVE  LEUKOCYTESUR NEGATIVE    Lipid Panel:   No results found for: CHOL, TRIG, HDL, CHOLHDL, VLDL, LDLCALC  HgbA1C: No results found for: HGBA1C  Urine Drug Screen:      Component Value Date/Time   LABOPIA NONE DETECTED 09/08/2017 0126   COCAINSCRNUR NONE DETECTED 09/08/2017 0126   LABBENZ NONE DETECTED 09/08/2017 0126   AMPHETMU NONE DETECTED 09/08/2017 0126   THCU NONE DETECTED 09/08/2017 0126   LABBARB (A) 09/08/2017 0126    Result not available. Reagent lot number recalled by manufacturer.    Alcohol Level: No results for input(s): ETH in the last 168 hours.  Other results: EKG: sinus rhythm at 85 bpm.  Imaging: Dg Chest 2 View  Result Date: 09/07/2017 CLINICAL DATA:  Altered mental status, fever and possible urinary tract infection. EXAM: CHEST - 2 VIEW COMPARISON:  CXR 07/31/2008 FINDINGS: Colonic interposition over the liver shadow with elevated right hemidiaphragm is noted. There is bibasilar subsegmental atelectasis with low lung volumes. No overt pulmonary edema. Posterior costophrenic angle pulmonary opacities likely reflect atelectatic change. Superimposed pneumonia would be difficult to entirely exclude as seen on  the lateral view. No acute osseous appearing abnormality. Tortuous atherosclerotic aorta. Heart size is within normal limits. IMPRESSION: 1. Elevated right hemidiaphragm with colonic interposition over the liver shadow. 2. Bibasilar atelectasis. Superimposed pneumonia projecting over the posterior costophrenic angle is not excluded given somewhat more confluent opacity seen on the lateral view. 3. Aortic atherosclerosis without aneurysm. Electronically Signed   By: Ashley Royalty M.D.   On: 09/07/2017 21:24   Ct Head Wo Contrast  Result Date: 09/08/2017 CLINICAL DATA:  Head trauma with headache EXAM: CT HEAD WITHOUT CONTRAST CT CERVICAL SPINE WITHOUT CONTRAST TECHNIQUE: Multidetector CT imaging of the head and cervical spine was performed following the standard protocol without intravenous contrast. Multiplanar CT  image reconstructions of the cervical spine were also generated. COMPARISON:  05/04/2017 head CT FINDINGS: CT HEAD FINDINGS Brain: No evidence of acute infarction, hemorrhage, hydrocephalus, or mass lesion. Subdural collection along the left frontal convexity has become CSF density. Maximal residual thickness is 11 mm along the frontal operculum with mild cortical mass effect. Stable asymmetric enlargement of CSF left lateral of the cerebellum which could be hygroma or expanded subarachnoid space. There is chronic small vessel ischemia in the cerebral white matter with confluent low-density. Chronic midline shift towards the right. Vascular: Atherosclerosis. Skull: 2 left-sided calvarial burr holes, presumably for subdural evacuation. Sinuses/Orbits: Negative CT CERVICAL SPINE FINDINGS Alignment: No traumatic malalignment. Facet mediated C7-T1 and T1-2 mild anterolisthesis. Skull base and vertebrae: Remote T1 and T3 compression fractures. No acute fracture. Soft tissues and spinal canal: No prevertebral fluid or swelling. No visible canal hematoma. Disc levels: Advanced diffuse disc and facet degeneration. Atlantooccipital degeneration with greater spurring on the right. Ligamentous thickening behind the dens with moderate spinal stenosis. Diffuse foraminal narrowing. Upper chest: No acute finding IMPRESSION: 1. No evidence of acute intracranial or cervical spine injury. 2. Remote subdural hematoma along the left cerebral convexity. 3. Chronic small vessel ischemia. 4. Advanced spinal degeneration as described. Electronically Signed   By: Monte Fantasia M.D.   On: 09/08/2017 00:18   Ct Chest W Contrast  Result Date: 09/08/2017 CLINICAL DATA:  Altered mental status and fever.  Recent fall. EXAM: CT CHEST, ABDOMEN, AND PELVIS WITH CONTRAST TECHNIQUE: Multidetector CT imaging of the chest, abdomen and pelvis was performed following the standard protocol during bolus administration of intravenous contrast. CONTRAST:   176mL OMNIPAQUE IOHEXOL 300 MG/ML  SOLN COMPARISON:  Remote abdomen/pelvis CT 11/05/2007 FINDINGS: CT CHEST FINDINGS Cardiovascular: Aortic atherosclerosis without acute aortic abnormality. Heart is normal in size. There are coronary artery calcifications. Minimal fluid in the superior pericardial recess. Mediastinum/Nodes: Borderline lower paratracheal node measures 11 mm short axis, likely reactive. No mediastinal hemorrhage or hematoma. Esophagus is decompressed. Visualized thyroid gland is normal. Lungs/Pleura: Elevated right hemidiaphragm with compressive atelectasis in the right lower and middle lobes. Peripheral subpleural reticulations throughout both lungs. No confluent consolidation. No pulmonary edema or pleural fluid. Musculoskeletal: Posterior right eighth, ninth, and tenth rib fractures have some surrounding but incomplete callus formation. Remote mild compression fractures of T1 and T3. No acute fractures. CT ABDOMEN PELVIS FINDINGS Hepatobiliary: Small subcentimeter hypodensities in the right hepatic lobe are too small to accurately characterize. No evidence of hepatic injury. Punctate granuloma in the left lobe. No calcified gallstone or gallbladder inflammation. Pancreas: Parenchymal atrophy. No ductal dilatation or inflammation. No pancreatic injury. Spleen: Normal in size without focal abnormality. No splenic injury. Adrenals/Urinary Tract: No adrenal hemorrhage or renal injury identified. No adrenal nodule. No hydronephrosis or perinephric edema. Homogeneous renal  enhancement with symmetric excretion on delayed phase imaging. 18 mm cyst in the lower right kidney. Cortical scarring in the mid lower right kidney with punctate parenchymal calcification. Diffuse urinary bladder wall thickening with mild perivesicular stranding. Stomach/Bowel: Bowel evaluation is limited in the absence of enteric contrast. Stomach is nondistended. No small bowel wall thickening, inflammatory change or obstruction.  Cecum appears high-riding in the right upper quadrant. No cecal wall thickening. Moderate stool in the colon with colonic redundancy. Appendix not confidently identified. No mesenteric hematoma or evidence of bowel injury. Vascular/Lymphatic: Aortic and branch atherosclerosis. No acute vascular findings. No abdominopelvic adenopathy. Reproductive: Prostatic calcifications. Other: No free air or ascites. No intra-abdominal abscess. Minimal soft tissue density/fluid in the left inguinal canal. Musculoskeletal: Mild scoliosis and degenerative change throughout the lumbar spine. Prominent Schmorl's node versus mild remote superior endplate compression L1. No fracture of the lumbar spine or bony pelvis. IMPRESSION: 1. Mild urinary bladder wall thickening and perivesicular edema suspicious for cystitis. 2. Right posterior eighth, ninth, and tenth rib fractures have some surrounding callus which is incomplete, these may be subacute. 3. Additional ancillary nonacute findings as described. 4.  Aortic Atherosclerosis (ICD10-I70.0). Electronically Signed   By: Jeb Levering M.D.   On: 09/08/2017 00:42   Ct Cervical Spine Wo Contrast  Result Date: 09/08/2017 CLINICAL DATA:  Head trauma with headache EXAM: CT HEAD WITHOUT CONTRAST CT CERVICAL SPINE WITHOUT CONTRAST TECHNIQUE: Multidetector CT imaging of the head and cervical spine was performed following the standard protocol without intravenous contrast. Multiplanar CT image reconstructions of the cervical spine were also generated. COMPARISON:  05/04/2017 head CT FINDINGS: CT HEAD FINDINGS Brain: No evidence of acute infarction, hemorrhage, hydrocephalus, or mass lesion. Subdural collection along the left frontal convexity has become CSF density. Maximal residual thickness is 11 mm along the frontal operculum with mild cortical mass effect. Stable asymmetric enlargement of CSF left lateral of the cerebellum which could be hygroma or expanded subarachnoid space. There is  chronic small vessel ischemia in the cerebral white matter with confluent low-density. Chronic midline shift towards the right. Vascular: Atherosclerosis. Skull: 2 left-sided calvarial burr holes, presumably for subdural evacuation. Sinuses/Orbits: Negative CT CERVICAL SPINE FINDINGS Alignment: No traumatic malalignment. Facet mediated C7-T1 and T1-2 mild anterolisthesis. Skull base and vertebrae: Remote T1 and T3 compression fractures. No acute fracture. Soft tissues and spinal canal: No prevertebral fluid or swelling. No visible canal hematoma. Disc levels: Advanced diffuse disc and facet degeneration. Atlantooccipital degeneration with greater spurring on the right. Ligamentous thickening behind the dens with moderate spinal stenosis. Diffuse foraminal narrowing. Upper chest: No acute finding IMPRESSION: 1. No evidence of acute intracranial or cervical spine injury. 2. Remote subdural hematoma along the left cerebral convexity. 3. Chronic small vessel ischemia. 4. Advanced spinal degeneration as described. Electronically Signed   By: Monte Fantasia M.D.   On: 09/08/2017 00:18   Mr Brain Wo Contrast  Result Date: 09/08/2017 CLINICAL DATA:  Initial evaluation for acute altered mental status EXAM: MRI HEAD WITHOUT CONTRAST TECHNIQUE: Multiplanar, multiecho pulse sequences of the brain and surrounding structures were obtained without intravenous contrast. COMPARISON:  Prior CT from 09/07/2017 FINDINGS: Brain: Moderately advanced cerebral atrophy with chronic small vessel ischemic disease. No abnormal foci of restricted diffusion to suggest acute or subacute ischemia. Gray-white matter differentiation maintained. No evidence for remote cortical infarction. Trace chronic subdural hemorrhage overlies the left cerebral convexity. Small chronic microhemorrhage noted within the mid right centrum semi ovale. No mass lesion, midline shift or mass effect.  Ventricular prominence related to global parenchymal volume loss  without hydrocephalus. Pituitary gland within normal limits. Vascular: Major intracranial vascular flow voids are maintained Skull and upper cervical spine: Degenerative thickening at the tectorial membrane with secondary narrowing at the craniocervical junction. Mild cervical spondylolysis noted within the visualized upper cervical spine. Bone marrow signal intensity within normal limits. Prior left frontal and parietal burr hole craniotomies noted, presumably for subdural evacuation. No acute scalp soft tissue abnormality. Sinuses/Orbits: Globes and orbital soft tissues demonstrate no acute finding. Patient is status post ocular lens replacement bilaterally. Scattered mucosal thickening within the ethmoidal air cells. Paranasal sinuses are otherwise clear. No significant mastoid effusion. Other: None. IMPRESSION: 1. No acute intracranial abnormality. 2. Trace chronic subdural hematoma overlying the left cerebral convexity. 3. Moderately advanced cerebral atrophy with chronic small vessel ischemic disease. Electronically Signed   By: Jeannine Boga M.D.   On: 09/08/2017 06:40   Ct Abdomen Pelvis W Contrast  Result Date: 09/08/2017 CLINICAL DATA:  Altered mental status and fever.  Recent fall. EXAM: CT CHEST, ABDOMEN, AND PELVIS WITH CONTRAST TECHNIQUE: Multidetector CT imaging of the chest, abdomen and pelvis was performed following the standard protocol during bolus administration of intravenous contrast. CONTRAST:  171mL OMNIPAQUE IOHEXOL 300 MG/ML  SOLN COMPARISON:  Remote abdomen/pelvis CT 11/05/2007 FINDINGS: CT CHEST FINDINGS Cardiovascular: Aortic atherosclerosis without acute aortic abnormality. Heart is normal in size. There are coronary artery calcifications. Minimal fluid in the superior pericardial recess. Mediastinum/Nodes: Borderline lower paratracheal node measures 11 mm short axis, likely reactive. No mediastinal hemorrhage or hematoma. Esophagus is decompressed. Visualized thyroid gland is  normal. Lungs/Pleura: Elevated right hemidiaphragm with compressive atelectasis in the right lower and middle lobes. Peripheral subpleural reticulations throughout both lungs. No confluent consolidation. No pulmonary edema or pleural fluid. Musculoskeletal: Posterior right eighth, ninth, and tenth rib fractures have some surrounding but incomplete callus formation. Remote mild compression fractures of T1 and T3. No acute fractures. CT ABDOMEN PELVIS FINDINGS Hepatobiliary: Small subcentimeter hypodensities in the right hepatic lobe are too small to accurately characterize. No evidence of hepatic injury. Punctate granuloma in the left lobe. No calcified gallstone or gallbladder inflammation. Pancreas: Parenchymal atrophy. No ductal dilatation or inflammation. No pancreatic injury. Spleen: Normal in size without focal abnormality. No splenic injury. Adrenals/Urinary Tract: No adrenal hemorrhage or renal injury identified. No adrenal nodule. No hydronephrosis or perinephric edema. Homogeneous renal enhancement with symmetric excretion on delayed phase imaging. 18 mm cyst in the lower right kidney. Cortical scarring in the mid lower right kidney with punctate parenchymal calcification. Diffuse urinary bladder wall thickening with mild perivesicular stranding. Stomach/Bowel: Bowel evaluation is limited in the absence of enteric contrast. Stomach is nondistended. No small bowel wall thickening, inflammatory change or obstruction. Cecum appears high-riding in the right upper quadrant. No cecal wall thickening. Moderate stool in the colon with colonic redundancy. Appendix not confidently identified. No mesenteric hematoma or evidence of bowel injury. Vascular/Lymphatic: Aortic and branch atherosclerosis. No acute vascular findings. No abdominopelvic adenopathy. Reproductive: Prostatic calcifications. Other: No free air or ascites. No intra-abdominal abscess. Minimal soft tissue density/fluid in the left inguinal canal.  Musculoskeletal: Mild scoliosis and degenerative change throughout the lumbar spine. Prominent Schmorl's node versus mild remote superior endplate compression L1. No fracture of the lumbar spine or bony pelvis. IMPRESSION: 1. Mild urinary bladder wall thickening and perivesicular edema suspicious for cystitis. 2. Right posterior eighth, ninth, and tenth rib fractures have some surrounding callus which is incomplete, these may be subacute. 3. Additional ancillary  nonacute findings as described. 4.  Aortic Atherosclerosis (ICD10-I70.0). Electronically Signed   By: Jeb Levering M.D.   On: 09/08/2017 00:42     Assessment/Plan: 84 year old male presenting with altered mental status.  Work up has ben fairly unrevealing with lab work being unremarkable.  Patient on no offending medications at home.  No evidence of infection.  MRI of the brain reviewed and shows no acute changes.  It does appear that the patient has been taking many significant falls recently with rib fractures that appear to be subacute noted on CT and now with scalp hematoma.  Patient very likely with a concussive syndrome superimposed on an underlying dementia.    Recommendations: 1.  PT/OT evaluation 2.  EEG 3.  B12 supplementation.  Patient currently low normal at 217.    Alexis Goodell, MD Neurology [phone] 09/08/2017, 6:56 PM

## 2017-09-08 NOTE — ED Provider Notes (Signed)
-----------------------------------------   1:20 AM on 09/08/2017 -----------------------------------------   Blood pressure 138/76, pulse 90, temperature 98.3 F (36.8 C), temperature source Oral, resp. rate 18, height 6' (1.829 m), weight 82 kg (180 lb 12.4 oz), SpO2 97 %.  Assuming care from Dr. Mariea Clonts.  In short, Danny James is a 84 y.o. male with a chief complaint of Altered Mental Status .  Refer to the original H&P for additional details.  The current plan of care is to follow up the results of the CT scans and disposition the patient.  CT head and cervical spine: No evidence of acute intracranial or cervical spine injury, remote subdural hematoma along the left cerebral convexity, chronic small vessel ischemia, advanced spinal degeneration as described.   CT chest abdomen and pelvis: Mild urinary bladder wall thickening and perivesicular edema suspicious for cystitis, right posterior eighth, ninth and 10th rib fractures some have surrounding callus which is incomplete these may be subacute, aortic atherosclerosis.  I did contact Dr. Lacinda Axon from neurosurgery regarding the patient's remote subdural.  Since the patient does not have any shift or mass-effect he does not feel that this is the cause of the patient's altered mental status.  He states though that if we do admit the patient to our hospital he would be willing to consult on the patient in the morning.  The patient will be admitted to the hospitalist service for altered mental status.  We are still awaiting a urinalysis for the patient.       Loney Hering, MD 09/08/17 [phone]

## 2017-09-08 NOTE — H&P (Signed)
[place] [facility] [place] NAME: Danny James    MR#:  [id number]  DATE OF BIRTH:  09/04/1920  DATE OF ADMISSION:  09/07/2017  PRIMARY CARE PHYSICIAN: Kirk Ruths, MD   REQUESTING/REFERRING PHYSICIAN: Loney Hering, MD  CHIEF COMPLAINT:   Chief Complaint  Patient presents with  . Altered Mental Status    HISTORY OF PRESENT ILLNESS:  Danny James  is a 84 y.o. male with a known history of mild dementia (AAOx3 at baseline), fecal incontinence, Hx L chronic subdural hematoma (07/2008, s/p craniotomy/evacuation/drain) p/w acute encephalopathy/AMS of unclear etiology. Pt AAOx2 (to person and place), cannot provide Hx. Hx from son at bedside. Pt's son tells me that pt typically AAOx3 and lives alone. He and his brother visit the pt at home and do groceries weekly, as well as call him daily. They have been trying to get him to move into assisted living for the past 71mo, but he has been resistant to doing so. They feel he is unsafe at home by himself. He has reportedly been falling, but he does not tell his family when he falls. His last major fall was in 07/2017.  His son tells me that he saw the pt on 07/04, at which time pt was in his usual state of health. He states he spoke to pt on the phone on 07/08, and noticed pt was incoherent, "talking out of his head"; son drove over, but pt was too weak to come to door; son broke door down, found pt sitting/slouched in chair unable to get up. EMS called.  As noted above, pt AAOx2 at time of my assessment. Pt inattentive and is having active visual hallucinations and talking to people who are not in the room. He can focus for a short time and follow simple commands. He is confabulating and saying nonsense. He only answers questions appropriately rarely. He is comfortable, does not appear septic/toxic, and is not in distress. He does not have neck stiffness/meningismus. He does not exhibit any obvious gross  focal neurological deficits in motor strength or cranial nerve function, and has 5/5 strength in all extremities on testing. He is afebrile and SIRS (-).  PAST MEDICAL HISTORY:  History reviewed. No pertinent past medical history.  PAST SURGICAL HISTORY:  History reviewed. No pertinent surgical history.  SOCIAL HISTORY:   Social History   Tobacco Use  . Smoking status: Never Smoker  . Smokeless tobacco: Never Used  Substance Use Topics  . Alcohol use: No    FAMILY HISTORY:  History reviewed. No pertinent family history.  DRUG ALLERGIES:  No Known Allergies  REVIEW OF SYSTEMS:   Review of Systems  Unable to perform ROS: Mental status change  Musculoskeletal: Positive for falls.  Neurological: Positive for weakness.  Psychiatric/Behavioral: Positive for memory loss.   MEDICATIONS AT HOME:   Prior to Admission medications   Not on File      VITAL SIGNS:  Blood pressure 133/78, pulse 90, temperature 98.3 F (36.8 C), temperature source Oral, resp. rate 18, height 6' (1.829 m), weight 82 kg (180 lb 12.4 oz), SpO2 97 %.  PHYSICAL EXAMINATION:  Physical Exam  Constitutional: He appears well-developed and well-nourished. He is active and cooperative.  Non-toxic appearance. He does not have a sickly appearance. He does not appear ill. No distress. He is not intubated.  HENT:  Head: Head is with abrasion and with contusion.  Mouth/Throat: Oropharynx is clear and moist. No oropharyngeal  exudate.  Eyes: Conjunctivae, EOM and lids are normal. No scleral icterus.  Neck: Neck supple. No JVD present. No thyromegaly present.  Cardiovascular: Normal rate, regular rhythm, S1 normal, S2 normal and normal heart sounds.  No extrasystoles are present. Exam reveals no gallop, no S3, no S4, no distant heart sounds and no friction rub.  No murmur heard. Pulmonary/Chest: Effort normal and breath sounds normal. No accessory muscle usage or stridor. No apnea, no tachypnea and no bradypnea.  He is not intubated. No respiratory distress. He has no decreased breath sounds. He has no wheezes. He has no rhonchi. He has no rales.  Abdominal: Soft. Bowel sounds are normal. He exhibits no distension. There is no tenderness. There is no rebound and no guarding.  Musculoskeletal: Normal range of motion. He exhibits no edema.  Lymphadenopathy:    He has no cervical adenopathy.  Neurological: He is alert. He has normal strength and normal reflexes. He is disoriented. No cranial nerve deficit.  Skin: Skin is warm and dry. Abrasion and bruising noted. No rash noted. He is not diaphoretic. No erythema.  Psychiatric: His mood appears not anxious. His affect is not angry. His speech is tangential. His speech is not rapid and/or pressured, not delayed and not slurred. He is actively hallucinating. He is not agitated, not aggressive, not hyperactive, not slowed, not withdrawn and not combative. Cognition and memory are impaired. He does not exhibit a depressed mood. He is communicative. He is inattentive.   (+) R scalp contusion/laceration. AAOx2. 5/5 strength in all extremities. Reflex normal. Cranial nerves grossly normal. Small abrasions/bruises on all extremities. LABORATORY PANEL:   CBC Recent Labs  Lab 09/07/17 2057  WBC 6.9  HGB 12.4*  HCT 35.6*  PLT 181   ------------------------------------------------------------------------------------------------------------------  Chemistries  Recent Labs  Lab 09/07/17 2057 09/08/17 0126  NA 140  --   K 3.4*  --   CL 110  --   CO2 23  --   GLUCOSE 87  --   BUN 19  --   CREATININE 0.74  --   CALCIUM 8.3*  --   MG  --  1.9  AST 33  --   ALT 17  --   ALKPHOS 53  --   BILITOT 1.2  --    ------------------------------------------------------------------------------------------------------------------  Cardiac Enzymes Recent Labs  Lab 09/07/17 2057  TROPONINI <0.03    ------------------------------------------------------------------------------------------------------------------  RADIOLOGY:  Dg Chest 2 View  Result Date: 09/07/2017 CLINICAL DATA:  Altered mental status, fever and possible urinary tract infection. EXAM: CHEST - 2 VIEW COMPARISON:  CXR 07/31/2008 FINDINGS: Colonic interposition over the liver shadow with elevated right hemidiaphragm is noted. There is bibasilar subsegmental atelectasis with low lung volumes. No overt pulmonary edema. Posterior costophrenic angle pulmonary opacities likely reflect atelectatic change. Superimposed pneumonia would be difficult to entirely exclude as seen on the lateral view. No acute osseous appearing abnormality. Tortuous atherosclerotic aorta. Heart size is within normal limits. IMPRESSION: 1. Elevated right hemidiaphragm with colonic interposition over the liver shadow. 2. Bibasilar atelectasis. Superimposed pneumonia projecting over the posterior costophrenic angle is not excluded given somewhat more confluent opacity seen on the lateral view. 3. Aortic atherosclerosis without aneurysm. Electronically Signed   By: Ashley Royalty M.D.   On: 09/07/2017 21:24   Ct Head Wo Contrast  Result Date: 09/08/2017 CLINICAL DATA:  Head trauma with headache EXAM: CT HEAD WITHOUT CONTRAST CT CERVICAL SPINE WITHOUT CONTRAST TECHNIQUE: Multidetector CT imaging of the head and cervical spine was performed following  the standard protocol without intravenous contrast. Multiplanar CT image reconstructions of the cervical spine were also generated. COMPARISON:  05/04/2017 head CT FINDINGS: CT HEAD FINDINGS Brain: No evidence of acute infarction, hemorrhage, hydrocephalus, or mass lesion. Subdural collection along the left frontal convexity has become CSF density. Maximal residual thickness is 11 mm along the frontal operculum with mild cortical mass effect. Stable asymmetric enlargement of CSF left lateral of the cerebellum which could be  hygroma or expanded subarachnoid space. There is chronic small vessel ischemia in the cerebral white matter with confluent low-density. Chronic midline shift towards the right. Vascular: Atherosclerosis. Skull: 2 left-sided calvarial burr holes, presumably for subdural evacuation. Sinuses/Orbits: Negative CT CERVICAL SPINE FINDINGS Alignment: No traumatic malalignment. Facet mediated C7-T1 and T1-2 mild anterolisthesis. Skull base and vertebrae: Remote T1 and T3 compression fractures. No acute fracture. Soft tissues and spinal canal: No prevertebral fluid or swelling. No visible canal hematoma. Disc levels: Advanced diffuse disc and facet degeneration. Atlantooccipital degeneration with greater spurring on the right. Ligamentous thickening behind the dens with moderate spinal stenosis. Diffuse foraminal narrowing. Upper chest: No acute finding IMPRESSION: 1. No evidence of acute intracranial or cervical spine injury. 2. Remote subdural hematoma along the left cerebral convexity. 3. Chronic small vessel ischemia. 4. Advanced spinal degeneration as described. Electronically Signed   By: Monte Fantasia M.D.   On: 09/08/2017 00:18   Ct Chest W Contrast  Result Date: 09/08/2017 CLINICAL DATA:  Altered mental status and fever.  Recent fall. EXAM: CT CHEST, ABDOMEN, AND PELVIS WITH CONTRAST TECHNIQUE: Multidetector CT imaging of the chest, abdomen and pelvis was performed following the standard protocol during bolus administration of intravenous contrast. CONTRAST:  129mL OMNIPAQUE IOHEXOL 300 MG/ML  SOLN COMPARISON:  Remote abdomen/pelvis CT 11/05/2007 FINDINGS: CT CHEST FINDINGS Cardiovascular: Aortic atherosclerosis without acute aortic abnormality. Heart is normal in size. There are coronary artery calcifications. Minimal fluid in the superior pericardial recess. Mediastinum/Nodes: Borderline lower paratracheal node measures 11 mm short axis, likely reactive. No mediastinal hemorrhage or hematoma. Esophagus is  decompressed. Visualized thyroid gland is normal. Lungs/Pleura: Elevated right hemidiaphragm with compressive atelectasis in the right lower and middle lobes. Peripheral subpleural reticulations throughout both lungs. No confluent consolidation. No pulmonary edema or pleural fluid. Musculoskeletal: Posterior right eighth, ninth, and tenth rib fractures have some surrounding but incomplete callus formation. Remote mild compression fractures of T1 and T3. No acute fractures. CT ABDOMEN PELVIS FINDINGS Hepatobiliary: Small subcentimeter hypodensities in the right hepatic lobe are too small to accurately characterize. No evidence of hepatic injury. Punctate granuloma in the left lobe. No calcified gallstone or gallbladder inflammation. Pancreas: Parenchymal atrophy. No ductal dilatation or inflammation. No pancreatic injury. Spleen: Normal in size without focal abnormality. No splenic injury. Adrenals/Urinary Tract: No adrenal hemorrhage or renal injury identified. No adrenal nodule. No hydronephrosis or perinephric edema. Homogeneous renal enhancement with symmetric excretion on delayed phase imaging. 18 mm cyst in the lower right kidney. Cortical scarring in the mid lower right kidney with punctate parenchymal calcification. Diffuse urinary bladder wall thickening with mild perivesicular stranding. Stomach/Bowel: Bowel evaluation is limited in the absence of enteric contrast. Stomach is nondistended. No small bowel wall thickening, inflammatory change or obstruction. Cecum appears high-riding in the right upper quadrant. No cecal wall thickening. Moderate stool in the colon with colonic redundancy. Appendix not confidently identified. No mesenteric hematoma or evidence of bowel injury. Vascular/Lymphatic: Aortic and branch atherosclerosis. No acute vascular findings. No abdominopelvic adenopathy. Reproductive: Prostatic calcifications. Other: No free air or  ascites. No intra-abdominal abscess. Minimal soft tissue  density/fluid in the left inguinal canal. Musculoskeletal: Mild scoliosis and degenerative change throughout the lumbar spine. Prominent Schmorl's node versus mild remote superior endplate compression L1. No fracture of the lumbar spine or bony pelvis. IMPRESSION: 1. Mild urinary bladder wall thickening and perivesicular edema suspicious for cystitis. 2. Right posterior eighth, ninth, and tenth rib fractures have some surrounding callus which is incomplete, these may be subacute. 3. Additional ancillary nonacute findings as described. 4.  Aortic Atherosclerosis (ICD10-I70.0). Electronically Signed   By: Jeb Levering M.D.   On: 09/08/2017 00:42   Ct Cervical Spine Wo Contrast  Result Date: 09/08/2017 CLINICAL DATA:  Head trauma with headache EXAM: CT HEAD WITHOUT CONTRAST CT CERVICAL SPINE WITHOUT CONTRAST TECHNIQUE: Multidetector CT imaging of the head and cervical spine was performed following the standard protocol without intravenous contrast. Multiplanar CT image reconstructions of the cervical spine were also generated. COMPARISON:  05/04/2017 head CT FINDINGS: CT HEAD FINDINGS Brain: No evidence of acute infarction, hemorrhage, hydrocephalus, or mass lesion. Subdural collection along the left frontal convexity has become CSF density. Maximal residual thickness is 11 mm along the frontal operculum with mild cortical mass effect. Stable asymmetric enlargement of CSF left lateral of the cerebellum which could be hygroma or expanded subarachnoid space. There is chronic small vessel ischemia in the cerebral white matter with confluent low-density. Chronic midline shift towards the right. Vascular: Atherosclerosis. Skull: 2 left-sided calvarial burr holes, presumably for subdural evacuation. Sinuses/Orbits: Negative CT CERVICAL SPINE FINDINGS Alignment: No traumatic malalignment. Facet mediated C7-T1 and T1-2 mild anterolisthesis. Skull base and vertebrae: Remote T1 and T3 compression fractures. No acute  fracture. Soft tissues and spinal canal: No prevertebral fluid or swelling. No visible canal hematoma. Disc levels: Advanced diffuse disc and facet degeneration. Atlantooccipital degeneration with greater spurring on the right. Ligamentous thickening behind the dens with moderate spinal stenosis. Diffuse foraminal narrowing. Upper chest: No acute finding IMPRESSION: 1. No evidence of acute intracranial or cervical spine injury. 2. Remote subdural hematoma along the left cerebral convexity. 3. Chronic small vessel ischemia. 4. Advanced spinal degeneration as described. Electronically Signed   By: Monte Fantasia M.D.   On: 09/08/2017 00:18   Ct Abdomen Pelvis W Contrast  Result Date: 09/08/2017 CLINICAL DATA:  Altered mental status and fever.  Recent fall. EXAM: CT CHEST, ABDOMEN, AND PELVIS WITH CONTRAST TECHNIQUE: Multidetector CT imaging of the chest, abdomen and pelvis was performed following the standard protocol during bolus administration of intravenous contrast. CONTRAST:  152mL OMNIPAQUE IOHEXOL 300 MG/ML  SOLN COMPARISON:  Remote abdomen/pelvis CT 11/05/2007 FINDINGS: CT CHEST FINDINGS Cardiovascular: Aortic atherosclerosis without acute aortic abnormality. Heart is normal in size. There are coronary artery calcifications. Minimal fluid in the superior pericardial recess. Mediastinum/Nodes: Borderline lower paratracheal node measures 11 mm short axis, likely reactive. No mediastinal hemorrhage or hematoma. Esophagus is decompressed. Visualized thyroid gland is normal. Lungs/Pleura: Elevated right hemidiaphragm with compressive atelectasis in the right lower and middle lobes. Peripheral subpleural reticulations throughout both lungs. No confluent consolidation. No pulmonary edema or pleural fluid. Musculoskeletal: Posterior right eighth, ninth, and tenth rib fractures have some surrounding but incomplete callus formation. Remote mild compression fractures of T1 and T3. No acute fractures. CT ABDOMEN  PELVIS FINDINGS Hepatobiliary: Small subcentimeter hypodensities in the right hepatic lobe are too small to accurately characterize. No evidence of hepatic injury. Punctate granuloma in the left lobe. No calcified gallstone or gallbladder inflammation. Pancreas: Parenchymal atrophy. No ductal dilatation or inflammation.  No pancreatic injury. Spleen: Normal in size without focal abnormality. No splenic injury. Adrenals/Urinary Tract: No adrenal hemorrhage or renal injury identified. No adrenal nodule. No hydronephrosis or perinephric edema. Homogeneous renal enhancement with symmetric excretion on delayed phase imaging. 18 mm cyst in the lower right kidney. Cortical scarring in the mid lower right kidney with punctate parenchymal calcification. Diffuse urinary bladder wall thickening with mild perivesicular stranding. Stomach/Bowel: Bowel evaluation is limited in the absence of enteric contrast. Stomach is nondistended. No small bowel wall thickening, inflammatory change or obstruction. Cecum appears high-riding in the right upper quadrant. No cecal wall thickening. Moderate stool in the colon with colonic redundancy. Appendix not confidently identified. No mesenteric hematoma or evidence of bowel injury. Vascular/Lymphatic: Aortic and branch atherosclerosis. No acute vascular findings. No abdominopelvic adenopathy. Reproductive: Prostatic calcifications. Other: No free air or ascites. No intra-abdominal abscess. Minimal soft tissue density/fluid in the left inguinal canal. Musculoskeletal: Mild scoliosis and degenerative change throughout the lumbar spine. Prominent Schmorl's node versus mild remote superior endplate compression L1. No fracture of the lumbar spine or bony pelvis. IMPRESSION: 1. Mild urinary bladder wall thickening and perivesicular edema suspicious for cystitis. 2. Right posterior eighth, ninth, and tenth rib fractures have some surrounding callus which is incomplete, these may be subacute. 3.  Additional ancillary nonacute findings as described. 4.  Aortic Atherosclerosis (ICD10-I70.0). Electronically Signed   By: Jeb Levering M.D.   On: 09/08/2017 00:42   IMPRESSION AND PLAN:   A/P: 89M acute encephalopathy/AMS of unknown etiology. Hypokalemia, dehydration, mechanical falls, normocytic anemia. Per outpt documentation, baseline mild dementia, fecal incontinence. -Per narrative, pt w/ acute confusional state x1d; AAOx3 at baseline, AAOx2 on present admission, (+) hallucinations/inattention, answers inappropriately at times, confabulating at times, follows simple commands -Per pt's family, pt w/ progressive generalized weakness, memory loss and increasing frequency of falls -Pt has been resistant to moving into assisted living; family believes he is unsafe at home by himself (pt presently lives alone) -Afebrile, (-) leukocytosis, SIRS (-), does not appear septic/toxic -(+) AMS/confusion (as described above), however no obvious gross focal neurological deficits in motor strength or cranial nerve function -CT imaging performed in ED notes mild urinary bladder wall thickening and perivesicular edema, suspicious for cystitis; U/A (-). More likely 2/2 dehydration, uSpGr 1.026 -Chest imaging (-) infiltrate/consolidation -Ammonia, TSH, Folate WNL -Glucose WNL -B12, RPR pending -CT head (+) remote subdural hematoma along left cerebral convexity (2/2 Hx subdural in 07/2008, s/p craniotomy/evacuation/drain), chronic small vessel ischemia, (-) acute intracranial process -MRI brain pending -Replete K+ -Mag level normal -Gentle IVF -Normocytic anemia likely anemia of chronic disease, no evidence of acute blood loss -Probably not safe to go home by himself; will likely need case management and social work involved, as family may want placement in assisted living -May also benefit from outpt Geriatrics/Neurology consultation for dementia -Not on any medications at home -Regular  diet -Lovenox -Full code -Observation, < 2 midnights; will likely need longer hospitalization, but unclear if pt meets criteria for inpatient admission per utilization review    All the records are reviewed and case discussed with ED provider. Management plans discussed with the patient, family and they are in agreement.  CODE STATUS: Full code  TOTAL TIME TAKING CARE OF THIS PATIENT: 90 minutes.    Arta Silence M.D on 09/08/2017 at 2:44 AM  Between 7am to 6pm - Pager - [phone]  After 6pm go to [email or web address] - Proofreader  [employer]  [phone]  CC: Primary care physician; Kirk Ruths, MD   Note: This dictation was prepared with Dragon dictation along with smaller phrase technology. Any transcriptional errors that result from this process are unintentional.

## 2017-09-08 NOTE — Progress Notes (Signed)
Patient becoming increasingly more agitated and combative.  Per Dr. Verdell Carmine Haldol ordered q 6 prn, and if not effective may contact on call hospitalist for another one time dose of 2.5-mg.  Also, per Dr. Verdell Carmine, NO ativan to be given.

## 2017-09-08 NOTE — Progress Notes (Signed)
[facility] NAME: Danny James    MR#:  [id number]  DATE OF BIRTH:  12-30-1920  SUBJECTIVE:   Patient here due to altered mental status/hallucinations/confusion.  Metabolic/neurologic work-up so far has been negative.  Patient continues to have hallucinations and delusions.  Patient's family is at bedside.  REVIEW OF SYSTEMS:    Review of Systems  Unable to perform ROS: Mental acuity    Nutrition: Regular Tolerating Diet: yes Tolerating PT: Await Eval.   DRUG ALLERGIES:  No Known Allergies  VITALS:  Blood pressure 121/81, pulse 74, temperature 98.7 F (37.1 C), temperature source Oral, resp. rate 15, height 6' (1.829 m), weight 82 kg (180 lb 12.4 oz), SpO2 94 %.  PHYSICAL EXAMINATION:   Physical Exam  GENERAL:  84 y.o.-year-old patient lying in bed with mitts on somewhat confused.  EYES: Pupils equal, round, reactive to light and accommodation. No scleral icterus. Extraocular muscles intact.  HEENT: Head atraumatic, normocephalic. Oropharynx and nasopharynx clear. Dry oral Mucosa. Bruise on the right side of head from recent fall.   NECK:  Supple, no jugular venous distention. No thyroid enlargement, no tenderness.  LUNGS: Normal breath sounds bilaterally, no wheezing, rales, rhonchi. No use of accessory muscles of respiration.  CARDIOVASCULAR: S1, S2 normal. No murmurs, rubs, or gallops.  ABDOMEN: Soft, nontender, nondistended. Bowel sounds present. No organomegaly or mass.  EXTREMITIES: No cyanosis, clubbing or edema b/l.    NEUROLOGIC: Cranial nerves II through XII are intact. No focal Motor or sensory deficits b/l.  Globally weak PSYCHIATRIC: The patient is alert and oriented x 1.  SKIN: No obvious rash, lesion, or ulcer. Skin tear on Right elbow.    LABORATORY PANEL:   CBC Recent Labs  Lab 09/07/17 2057  WBC 6.9  HGB 12.4*  HCT 35.6*  PLT 181    ------------------------------------------------------------------------------------------------------------------  Chemistries  Recent Labs  Lab 09/07/17 2057 09/08/17 0126 09/08/17 0526  NA 140  --  140  K 3.4*  --  3.3*  CL 110  --  109  CO2 23  --  22  GLUCOSE 87  --  80  BUN 19  --  15  CREATININE 0.74  --  0.79  CALCIUM 8.3*  --  8.1*  MG  --  1.9  --   AST 33  --   --   ALT 17  --   --   ALKPHOS 53  --   --   BILITOT 1.2  --   --    ------------------------------------------------------------------------------------------------------------------  Cardiac Enzymes Recent Labs  Lab 09/07/17 2057  TROPONINI <0.03   ------------------------------------------------------------------------------------------------------------------  RADIOLOGY:  Dg Chest 2 View  Result Date: 09/07/2017 CLINICAL DATA:  Altered mental status, fever and possible urinary tract infection. EXAM: CHEST - 2 VIEW COMPARISON:  CXR 07/31/2008 FINDINGS: Colonic interposition over the liver shadow with elevated right hemidiaphragm is noted. There is bibasilar subsegmental atelectasis with low lung volumes. No overt pulmonary edema. Posterior costophrenic angle pulmonary opacities likely reflect atelectatic change. Superimposed pneumonia would be difficult to entirely exclude as seen on the lateral view. No acute osseous appearing abnormality. Tortuous atherosclerotic aorta. Heart size is within normal limits. IMPRESSION: 1. Elevated right hemidiaphragm with colonic interposition over the liver shadow. 2. Bibasilar atelectasis. Superimposed pneumonia projecting over the posterior costophrenic angle is not excluded given somewhat more confluent opacity seen on the lateral view. 3. Aortic atherosclerosis without aneurysm. Electronically Signed   By: Meredith Leeds.D.  On: 09/07/2017 21:24   Ct Head Wo Contrast  Result Date: 09/08/2017 CLINICAL DATA:  Head trauma with headache EXAM: CT HEAD WITHOUT CONTRAST CT  CERVICAL SPINE WITHOUT CONTRAST TECHNIQUE: Multidetector CT imaging of the head and cervical spine was performed following the standard protocol without intravenous contrast. Multiplanar CT image reconstructions of the cervical spine were also generated. COMPARISON:  05/04/2017 head CT FINDINGS: CT HEAD FINDINGS Brain: No evidence of acute infarction, hemorrhage, hydrocephalus, or mass lesion. Subdural collection along the left frontal convexity has become CSF density. Maximal residual thickness is 11 mm along the frontal operculum with mild cortical mass effect. Stable asymmetric enlargement of CSF left lateral of the cerebellum which could be hygroma or expanded subarachnoid space. There is chronic small vessel ischemia in the cerebral white matter with confluent low-density. Chronic midline shift towards the right. Vascular: Atherosclerosis. Skull: 2 left-sided calvarial burr holes, presumably for subdural evacuation. Sinuses/Orbits: Negative CT CERVICAL SPINE FINDINGS Alignment: No traumatic malalignment. Facet mediated C7-T1 and T1-2 mild anterolisthesis. Skull base and vertebrae: Remote T1 and T3 compression fractures. No acute fracture. Soft tissues and spinal canal: No prevertebral fluid or swelling. No visible canal hematoma. Disc levels: Advanced diffuse disc and facet degeneration. Atlantooccipital degeneration with greater spurring on the right. Ligamentous thickening behind the dens with moderate spinal stenosis. Diffuse foraminal narrowing. Upper chest: No acute finding IMPRESSION: 1. No evidence of acute intracranial or cervical spine injury. 2. Remote subdural hematoma along the left cerebral convexity. 3. Chronic small vessel ischemia. 4. Advanced spinal degeneration as described. Electronically Signed   By: Monte Fantasia M.D.   On: 09/08/2017 00:18   Ct Chest W Contrast  Result Date: 09/08/2017 CLINICAL DATA:  Altered mental status and fever.  Recent fall. EXAM: CT CHEST, ABDOMEN, AND PELVIS  WITH CONTRAST TECHNIQUE: Multidetector CT imaging of the chest, abdomen and pelvis was performed following the standard protocol during bolus administration of intravenous contrast. CONTRAST:  118mL OMNIPAQUE IOHEXOL 300 MG/ML  SOLN COMPARISON:  Remote abdomen/pelvis CT 11/05/2007 FINDINGS: CT CHEST FINDINGS Cardiovascular: Aortic atherosclerosis without acute aortic abnormality. Heart is normal in size. There are coronary artery calcifications. Minimal fluid in the superior pericardial recess. Mediastinum/Nodes: Borderline lower paratracheal node measures 11 mm short axis, likely reactive. No mediastinal hemorrhage or hematoma. Esophagus is decompressed. Visualized thyroid gland is normal. Lungs/Pleura: Elevated right hemidiaphragm with compressive atelectasis in the right lower and middle lobes. Peripheral subpleural reticulations throughout both lungs. No confluent consolidation. No pulmonary edema or pleural fluid. Musculoskeletal: Posterior right eighth, ninth, and tenth rib fractures have some surrounding but incomplete callus formation. Remote mild compression fractures of T1 and T3. No acute fractures. CT ABDOMEN PELVIS FINDINGS Hepatobiliary: Small subcentimeter hypodensities in the right hepatic lobe are too small to accurately characterize. No evidence of hepatic injury. Punctate granuloma in the left lobe. No calcified gallstone or gallbladder inflammation. Pancreas: Parenchymal atrophy. No ductal dilatation or inflammation. No pancreatic injury. Spleen: Normal in size without focal abnormality. No splenic injury. Adrenals/Urinary Tract: No adrenal hemorrhage or renal injury identified. No adrenal nodule. No hydronephrosis or perinephric edema. Homogeneous renal enhancement with symmetric excretion on delayed phase imaging. 18 mm cyst in the lower right kidney. Cortical scarring in the mid lower right kidney with punctate parenchymal calcification. Diffuse urinary bladder wall thickening with mild  perivesicular stranding. Stomach/Bowel: Bowel evaluation is limited in the absence of enteric contrast. Stomach is nondistended. No small bowel wall thickening, inflammatory change or obstruction. Cecum appears high-riding in the right upper quadrant.  No cecal wall thickening. Moderate stool in the colon with colonic redundancy. Appendix not confidently identified. No mesenteric hematoma or evidence of bowel injury. Vascular/Lymphatic: Aortic and branch atherosclerosis. No acute vascular findings. No abdominopelvic adenopathy. Reproductive: Prostatic calcifications. Other: No free air or ascites. No intra-abdominal abscess. Minimal soft tissue density/fluid in the left inguinal canal. Musculoskeletal: Mild scoliosis and degenerative change throughout the lumbar spine. Prominent Schmorl's node versus mild remote superior endplate compression L1. No fracture of the lumbar spine or bony pelvis. IMPRESSION: 1. Mild urinary bladder wall thickening and perivesicular edema suspicious for cystitis. 2. Right posterior eighth, ninth, and tenth rib fractures have some surrounding callus which is incomplete, these may be subacute. 3. Additional ancillary nonacute findings as described. 4.  Aortic Atherosclerosis (ICD10-I70.0). Electronically Signed   By: Jeb Levering M.D.   On: 09/08/2017 00:42   Ct Cervical Spine Wo Contrast  Result Date: 09/08/2017 CLINICAL DATA:  Head trauma with headache EXAM: CT HEAD WITHOUT CONTRAST CT CERVICAL SPINE WITHOUT CONTRAST TECHNIQUE: Multidetector CT imaging of the head and cervical spine was performed following the standard protocol without intravenous contrast. Multiplanar CT image reconstructions of the cervical spine were also generated. COMPARISON:  05/04/2017 head CT FINDINGS: CT HEAD FINDINGS Brain: No evidence of acute infarction, hemorrhage, hydrocephalus, or mass lesion. Subdural collection along the left frontal convexity has become CSF density. Maximal residual thickness is  11 mm along the frontal operculum with mild cortical mass effect. Stable asymmetric enlargement of CSF left lateral of the cerebellum which could be hygroma or expanded subarachnoid space. There is chronic small vessel ischemia in the cerebral white matter with confluent low-density. Chronic midline shift towards the right. Vascular: Atherosclerosis. Skull: 2 left-sided calvarial burr holes, presumably for subdural evacuation. Sinuses/Orbits: Negative CT CERVICAL SPINE FINDINGS Alignment: No traumatic malalignment. Facet mediated C7-T1 and T1-2 mild anterolisthesis. Skull base and vertebrae: Remote T1 and T3 compression fractures. No acute fracture. Soft tissues and spinal canal: No prevertebral fluid or swelling. No visible canal hematoma. Disc levels: Advanced diffuse disc and facet degeneration. Atlantooccipital degeneration with greater spurring on the right. Ligamentous thickening behind the dens with moderate spinal stenosis. Diffuse foraminal narrowing. Upper chest: No acute finding IMPRESSION: 1. No evidence of acute intracranial or cervical spine injury. 2. Remote subdural hematoma along the left cerebral convexity. 3. Chronic small vessel ischemia. 4. Advanced spinal degeneration as described. Electronically Signed   By: Monte Fantasia M.D.   On: 09/08/2017 00:18   Mr Brain Wo Contrast  Result Date: 09/08/2017 CLINICAL DATA:  Initial evaluation for acute altered mental status EXAM: MRI HEAD WITHOUT CONTRAST TECHNIQUE: Multiplanar, multiecho pulse sequences of the brain and surrounding structures were obtained without intravenous contrast. COMPARISON:  Prior CT from 09/07/2017 FINDINGS: Brain: Moderately advanced cerebral atrophy with chronic small vessel ischemic disease. No abnormal foci of restricted diffusion to suggest acute or subacute ischemia. Gray-white matter differentiation maintained. No evidence for remote cortical infarction. Trace chronic subdural hemorrhage overlies the left cerebral  convexity. Small chronic microhemorrhage noted within the mid right centrum semi ovale. No mass lesion, midline shift or mass effect. Ventricular prominence related to global parenchymal volume loss without hydrocephalus. Pituitary gland within normal limits. Vascular: Major intracranial vascular flow voids are maintained Skull and upper cervical spine: Degenerative thickening at the tectorial membrane with secondary narrowing at the craniocervical junction. Mild cervical spondylolysis noted within the visualized upper cervical spine. Bone marrow signal intensity within normal limits. Prior left frontal and parietal burr hole craniotomies noted, presumably  for subdural evacuation. No acute scalp soft tissue abnormality. Sinuses/Orbits: Globes and orbital soft tissues demonstrate no acute finding. Patient is status post ocular lens replacement bilaterally. Scattered mucosal thickening within the ethmoidal air cells. Paranasal sinuses are otherwise clear. No significant mastoid effusion. Other: None. IMPRESSION: 1. No acute intracranial abnormality. 2. Trace chronic subdural hematoma overlying the left cerebral convexity. 3. Moderately advanced cerebral atrophy with chronic small vessel ischemic disease. Electronically Signed   By: Jeannine Boga M.D.   On: 09/08/2017 06:40   Ct Abdomen Pelvis W Contrast  Result Date: 09/08/2017 CLINICAL DATA:  Altered mental status and fever.  Recent fall. EXAM: CT CHEST, ABDOMEN, AND PELVIS WITH CONTRAST TECHNIQUE: Multidetector CT imaging of the chest, abdomen and pelvis was performed following the standard protocol during bolus administration of intravenous contrast. CONTRAST:  146mL OMNIPAQUE IOHEXOL 300 MG/ML  SOLN COMPARISON:  Remote abdomen/pelvis CT 11/05/2007 FINDINGS: CT CHEST FINDINGS Cardiovascular: Aortic atherosclerosis without acute aortic abnormality. Heart is normal in size. There are coronary artery calcifications. Minimal fluid in the superior pericardial  recess. Mediastinum/Nodes: Borderline lower paratracheal node measures 11 mm short axis, likely reactive. No mediastinal hemorrhage or hematoma. Esophagus is decompressed. Visualized thyroid gland is normal. Lungs/Pleura: Elevated right hemidiaphragm with compressive atelectasis in the right lower and middle lobes. Peripheral subpleural reticulations throughout both lungs. No confluent consolidation. No pulmonary edema or pleural fluid. Musculoskeletal: Posterior right eighth, ninth, and tenth rib fractures have some surrounding but incomplete callus formation. Remote mild compression fractures of T1 and T3. No acute fractures. CT ABDOMEN PELVIS FINDINGS Hepatobiliary: Small subcentimeter hypodensities in the right hepatic lobe are too small to accurately characterize. No evidence of hepatic injury. Punctate granuloma in the left lobe. No calcified gallstone or gallbladder inflammation. Pancreas: Parenchymal atrophy. No ductal dilatation or inflammation. No pancreatic injury. Spleen: Normal in size without focal abnormality. No splenic injury. Adrenals/Urinary Tract: No adrenal hemorrhage or renal injury identified. No adrenal nodule. No hydronephrosis or perinephric edema. Homogeneous renal enhancement with symmetric excretion on delayed phase imaging. 18 mm cyst in the lower right kidney. Cortical scarring in the mid lower right kidney with punctate parenchymal calcification. Diffuse urinary bladder wall thickening with mild perivesicular stranding. Stomach/Bowel: Bowel evaluation is limited in the absence of enteric contrast. Stomach is nondistended. No small bowel wall thickening, inflammatory change or obstruction. Cecum appears high-riding in the right upper quadrant. No cecal wall thickening. Moderate stool in the colon with colonic redundancy. Appendix not confidently identified. No mesenteric hematoma or evidence of bowel injury. Vascular/Lymphatic: Aortic and branch atherosclerosis. No acute vascular  findings. No abdominopelvic adenopathy. Reproductive: Prostatic calcifications. Other: No free air or ascites. No intra-abdominal abscess. Minimal soft tissue density/fluid in the left inguinal canal. Musculoskeletal: Mild scoliosis and degenerative change throughout the lumbar spine. Prominent Schmorl's node versus mild remote superior endplate compression L1. No fracture of the lumbar spine or bony pelvis. IMPRESSION: 1. Mild urinary bladder wall thickening and perivesicular edema suspicious for cystitis. 2. Right posterior eighth, ninth, and tenth rib fractures have some surrounding callus which is incomplete, these may be subacute. 3. Additional ancillary nonacute findings as described. 4.  Aortic Atherosclerosis (ICD10-I70.0). Electronically Signed   By: Jeb Levering M.D.   On: 09/08/2017 00:42     ASSESSMENT AND PLAN:   84 year old male with no significant past medical history presents to the hospital due to altered mental status/confusion/delirium.  1.  Altered mental status/confusion/delirium-etiology unclear presently.  Suspected to be secondary to underlying mild cognitive decline combined with  postconcussion syndrome as per neurology. -Patient has no previous history of dementia as per the family.  He was in his usual state of health as of a few days ago and presented to the hospital with hallucinations and delusion and confusion. - Urinalysis is negative for UTI, chest x-ray negative for pneumonia.  CT head, MRI of the brain shows no acute neurologic pathology. -Neurology consult obtained and the recommend getting a EEG. -Continue supportive care and avoid sedative meds.  Keep awake during the days, sleep at nights.  Avoid benzodiazepines. -Continue to follow mental status.  2.  Hypokalemia- we will give oral potassium supplements repeat level in the morning.  3.  Generalized weakness/recurrent falls- will await physical therapy evaluation to assess mobility status.  Patient may  possibly need placement to short-term rehab/assisted living.   All the records are reviewed and case discussed with Care Management/Social Worker. Management plans discussed with the patient, family and they are in agreement.  CODE STATUS: Full code  DVT Prophylaxis: Lovenox  TOTAL TIME TAKING CARE OF THIS PATIENT: 30 minutes.   POSSIBLE D/C IN 1-2 DAYS, DEPENDING ON CLINICAL CONDITION.   Henreitta Leber M.D on 09/08/2017 at 2:57 PM  Between 7am to 6pm - Pager - [phone]  After 6pm go to [email or web address] - [employer] [facility]  Office  [phone]  CC: Primary care physician; Kirk Ruths, MD

## 2017-09-08 NOTE — Progress Notes (Signed)
Patient off the unit, being transported to MRI by tech.

## 2017-09-09 ENCOUNTER — Observation Stay: Payer: Medicare Other

## 2017-09-09 DIAGNOSIS — R4182 Altered mental status, unspecified: Secondary | ICD-10-CM | POA: Diagnosis not present

## 2017-09-09 LAB — BASIC METABOLIC PANEL
ANION GAP: 9 (ref 5–15)
BUN: 12 mg/dL (ref 8–23)
CO2: 22 mmol/L (ref 22–32)
Calcium: 8.1 mg/dL — ABNORMAL LOW (ref 8.9–10.3)
Chloride: 106 mmol/L (ref 98–111)
Creatinine, Ser: 0.76 mg/dL (ref 0.61–1.24)
GLUCOSE: 86 mg/dL (ref 70–99)
POTASSIUM: 4.1 mmol/L (ref 3.5–5.1)
Sodium: 137 mmol/L (ref 135–145)

## 2017-09-09 LAB — CBC
HCT: 39.7 % — ABNORMAL LOW (ref 40.0–52.0)
Hemoglobin: 14 g/dL (ref 13.0–18.0)
MCH: 33.1 pg (ref 26.0–34.0)
MCHC: 35.2 g/dL (ref 32.0–36.0)
MCV: 93.8 fL (ref 80.0–100.0)
PLATELETS: 194 10*3/uL (ref 150–440)
RBC: 4.24 MIL/uL — AB (ref 4.40–5.90)
RDW: 14.9 % — ABNORMAL HIGH (ref 11.5–14.5)
WBC: 8.8 10*3/uL (ref 3.8–10.6)

## 2017-09-09 LAB — SYPHILIS: RPR W/REFLEX TO RPR TITER AND TREPONEMAL ANTIBODIES, TRADITIONAL SCREENING AND DIAGNOSIS ALGORITHM: RPR Ser Ql: NONREACTIVE

## 2017-09-09 MED ORDER — LORAZEPAM 2 MG/ML IJ SOLN
1.0000 mg | INTRAMUSCULAR | Status: DC | PRN
  Administered 2017-09-09: 1 mg via INTRAVENOUS
  Filled 2017-09-09: qty 1

## 2017-09-09 MED ORDER — QUETIAPINE FUMARATE 25 MG PO TABS
50.0000 mg | ORAL_TABLET | Freq: Every day | ORAL | Status: DC
  Filled 2017-09-09: qty 2

## 2017-09-09 NOTE — Progress Notes (Signed)
eeg complete.

## 2017-09-09 NOTE — Progress Notes (Signed)
[place] [facility] [place] NAME: Danny James    MR#:  [id number]  DATE OF BIRTH:  1920-07-15  SUBJECTIVE:   Patient more awake alert but still confused  REVIEW OF SYSTEMS:    Review of Systems  Unable to perform ROS: Mental acuity    Nutrition: Regular Tolerating Diet: yes Tolerating PT: Await Eval.   DRUG ALLERGIES:  No Known Allergies  VITALS:  Blood pressure 135/83, pulse 84, temperature 98.5 F (36.9 C), temperature source Axillary, resp. rate 18, height 6' (1.829 m), weight 82 kg (180 lb 12.4 oz), SpO2 98 %.  PHYSICAL EXAMINATION:   Physical Exam  GENERAL:  84 y.o.-year-old patient lying in bed with mitts on somewhat confused.  EYES: Pupils equal, round, reactive to light and accommodation. No scleral icterus. Extraocular muscles intact.  HEENT: Head atraumatic, normocephalic. Oropharynx and nasopharynx clear. Dry oral Mucosa. Bruise on the right side of head from recent fall.   NECK:  Supple, no jugular venous distention. No thyroid enlargement, no tenderness.  LUNGS: Normal breath sounds bilaterally, no wheezing, rales, rhonchi. No use of accessory muscles of respiration.  CARDIOVASCULAR: S1, S2 normal. No murmurs, rubs, or gallops.  ABDOMEN: Soft, nontender, nondistended. Bowel sounds present. No organomegaly or mass.  EXTREMITIES: No cyanosis, clubbing or edema b/l.    NEUROLOGIC: Cranial nerves II through XII are intact. No focal Motor or sensory deficits b/l.  Globally weak PSYCHIATRIC: The patient is alert and oriented x 1.  SKIN: No obvious rash, lesion, or ulcer. Skin tear on Right elbow.    LABORATORY PANEL:   CBC Recent Labs  Lab 09/09/17 0743  WBC 8.8  HGB 14.0  HCT 39.7*  PLT 194   ------------------------------------------------------------------------------------------------------------------  Chemistries  Recent Labs  Lab 09/07/17 2057 09/08/17 0126  09/09/17 0743  NA 140  --    < > 137  K 3.4*  --     < > 4.1  CL 110  --    < > 106  CO2 23  --    < > 22  GLUCOSE 87  --    < > 86  BUN 19  --    < > 12  CREATININE 0.74  --    < > 0.76  CALCIUM 8.3*  --    < > 8.1*  MG  --  1.9  --   --   AST 33  --   --   --   ALT 17  --   --   --   ALKPHOS 53  --   --   --   BILITOT 1.2  --   --   --    < > = values in this interval not displayed.   ------------------------------------------------------------------------------------------------------------------  Cardiac Enzymes Recent Labs  Lab 09/07/17 2057  TROPONINI <0.03   ------------------------------------------------------------------------------------------------------------------  RADIOLOGY:  Dg Chest 2 View  Result Date: 09/07/2017 CLINICAL DATA:  Altered mental status, fever and possible urinary tract infection. EXAM: CHEST - 2 VIEW COMPARISON:  CXR 07/31/2008 FINDINGS: Colonic interposition over the liver shadow with elevated right hemidiaphragm is noted. There is bibasilar subsegmental atelectasis with low lung volumes. No overt pulmonary edema. Posterior costophrenic angle pulmonary opacities likely reflect atelectatic change. Superimposed pneumonia would be difficult to entirely exclude as seen on the lateral view. No acute osseous appearing abnormality. Tortuous atherosclerotic aorta. Heart size is within normal limits. IMPRESSION: 1. Elevated right hemidiaphragm with colonic interposition over the liver shadow. 2. Bibasilar  atelectasis. Superimposed pneumonia projecting over the posterior costophrenic angle is not excluded given somewhat more confluent opacity seen on the lateral view. 3. Aortic atherosclerosis without aneurysm. Electronically Signed   By: Ashley Royalty M.D.   On: 09/07/2017 21:24   Ct Head Wo Contrast  Result Date: 09/08/2017 CLINICAL DATA:  Head trauma with headache EXAM: CT HEAD WITHOUT CONTRAST CT CERVICAL SPINE WITHOUT CONTRAST TECHNIQUE: Multidetector CT imaging of the head and cervical spine was performed  following the standard protocol without intravenous contrast. Multiplanar CT image reconstructions of the cervical spine were also generated. COMPARISON:  05/04/2017 head CT FINDINGS: CT HEAD FINDINGS Brain: No evidence of acute infarction, hemorrhage, hydrocephalus, or mass lesion. Subdural collection along the left frontal convexity has become CSF density. Maximal residual thickness is 11 mm along the frontal operculum with mild cortical mass effect. Stable asymmetric enlargement of CSF left lateral of the cerebellum which could be hygroma or expanded subarachnoid space. There is chronic small vessel ischemia in the cerebral white matter with confluent low-density. Chronic midline shift towards the right. Vascular: Atherosclerosis. Skull: 2 left-sided calvarial burr holes, presumably for subdural evacuation. Sinuses/Orbits: Negative CT CERVICAL SPINE FINDINGS Alignment: No traumatic malalignment. Facet mediated C7-T1 and T1-2 mild anterolisthesis. Skull base and vertebrae: Remote T1 and T3 compression fractures. No acute fracture. Soft tissues and spinal canal: No prevertebral fluid or swelling. No visible canal hematoma. Disc levels: Advanced diffuse disc and facet degeneration. Atlantooccipital degeneration with greater spurring on the right. Ligamentous thickening behind the dens with moderate spinal stenosis. Diffuse foraminal narrowing. Upper chest: No acute finding IMPRESSION: 1. No evidence of acute intracranial or cervical spine injury. 2. Remote subdural hematoma along the left cerebral convexity. 3. Chronic small vessel ischemia. 4. Advanced spinal degeneration as described. Electronically Signed   By: Monte Fantasia M.D.   On: 09/08/2017 00:18   Ct Chest W Contrast  Result Date: 09/08/2017 CLINICAL DATA:  Altered mental status and fever.  Recent fall. EXAM: CT CHEST, ABDOMEN, AND PELVIS WITH CONTRAST TECHNIQUE: Multidetector CT imaging of the chest, abdomen and pelvis was performed following the  standard protocol during bolus administration of intravenous contrast. CONTRAST:  188mL OMNIPAQUE IOHEXOL 300 MG/ML  SOLN COMPARISON:  Remote abdomen/pelvis CT 11/05/2007 FINDINGS: CT CHEST FINDINGS Cardiovascular: Aortic atherosclerosis without acute aortic abnormality. Heart is normal in size. There are coronary artery calcifications. Minimal fluid in the superior pericardial recess. Mediastinum/Nodes: Borderline lower paratracheal node measures 11 mm short axis, likely reactive. No mediastinal hemorrhage or hematoma. Esophagus is decompressed. Visualized thyroid gland is normal. Lungs/Pleura: Elevated right hemidiaphragm with compressive atelectasis in the right lower and middle lobes. Peripheral subpleural reticulations throughout both lungs. No confluent consolidation. No pulmonary edema or pleural fluid. Musculoskeletal: Posterior right eighth, ninth, and tenth rib fractures have some surrounding but incomplete callus formation. Remote mild compression fractures of T1 and T3. No acute fractures. CT ABDOMEN PELVIS FINDINGS Hepatobiliary: Small subcentimeter hypodensities in the right hepatic lobe are too small to accurately characterize. No evidence of hepatic injury. Punctate granuloma in the left lobe. No calcified gallstone or gallbladder inflammation. Pancreas: Parenchymal atrophy. No ductal dilatation or inflammation. No pancreatic injury. Spleen: Normal in size without focal abnormality. No splenic injury. Adrenals/Urinary Tract: No adrenal hemorrhage or renal injury identified. No adrenal nodule. No hydronephrosis or perinephric edema. Homogeneous renal enhancement with symmetric excretion on delayed phase imaging. 18 mm cyst in the lower right kidney. Cortical scarring in the mid lower right kidney with punctate parenchymal calcification. Diffuse urinary bladder  wall thickening with mild perivesicular stranding. Stomach/Bowel: Bowel evaluation is limited in the absence of enteric contrast. Stomach is  nondistended. No small bowel wall thickening, inflammatory change or obstruction. Cecum appears high-riding in the right upper quadrant. No cecal wall thickening. Moderate stool in the colon with colonic redundancy. Appendix not confidently identified. No mesenteric hematoma or evidence of bowel injury. Vascular/Lymphatic: Aortic and branch atherosclerosis. No acute vascular findings. No abdominopelvic adenopathy. Reproductive: Prostatic calcifications. Other: No free air or ascites. No intra-abdominal abscess. Minimal soft tissue density/fluid in the left inguinal canal. Musculoskeletal: Mild scoliosis and degenerative change throughout the lumbar spine. Prominent Schmorl's node versus mild remote superior endplate compression L1. No fracture of the lumbar spine or bony pelvis. IMPRESSION: 1. Mild urinary bladder wall thickening and perivesicular edema suspicious for cystitis. 2. Right posterior eighth, ninth, and tenth rib fractures have some surrounding callus which is incomplete, these may be subacute. 3. Additional ancillary nonacute findings as described. 4.  Aortic Atherosclerosis (ICD10-I70.0). Electronically Signed   By: Jeb Levering M.D.   On: 09/08/2017 00:42   Ct Cervical Spine Wo Contrast  Result Date: 09/08/2017 CLINICAL DATA:  Head trauma with headache EXAM: CT HEAD WITHOUT CONTRAST CT CERVICAL SPINE WITHOUT CONTRAST TECHNIQUE: Multidetector CT imaging of the head and cervical spine was performed following the standard protocol without intravenous contrast. Multiplanar CT image reconstructions of the cervical spine were also generated. COMPARISON:  05/04/2017 head CT FINDINGS: CT HEAD FINDINGS Brain: No evidence of acute infarction, hemorrhage, hydrocephalus, or mass lesion. Subdural collection along the left frontal convexity has become CSF density. Maximal residual thickness is 11 mm along the frontal operculum with mild cortical mass effect. Stable asymmetric enlargement of CSF left lateral  of the cerebellum which could be hygroma or expanded subarachnoid space. There is chronic small vessel ischemia in the cerebral white matter with confluent low-density. Chronic midline shift towards the right. Vascular: Atherosclerosis. Skull: 2 left-sided calvarial burr holes, presumably for subdural evacuation. Sinuses/Orbits: Negative CT CERVICAL SPINE FINDINGS Alignment: No traumatic malalignment. Facet mediated C7-T1 and T1-2 mild anterolisthesis. Skull base and vertebrae: Remote T1 and T3 compression fractures. No acute fracture. Soft tissues and spinal canal: No prevertebral fluid or swelling. No visible canal hematoma. Disc levels: Advanced diffuse disc and facet degeneration. Atlantooccipital degeneration with greater spurring on the right. Ligamentous thickening behind the dens with moderate spinal stenosis. Diffuse foraminal narrowing. Upper chest: No acute finding IMPRESSION: 1. No evidence of acute intracranial or cervical spine injury. 2. Remote subdural hematoma along the left cerebral convexity. 3. Chronic small vessel ischemia. 4. Advanced spinal degeneration as described. Electronically Signed   By: Monte Fantasia M.D.   On: 09/08/2017 00:18   Mr Brain Wo Contrast  Result Date: 09/08/2017 CLINICAL DATA:  Initial evaluation for acute altered mental status EXAM: MRI HEAD WITHOUT CONTRAST TECHNIQUE: Multiplanar, multiecho pulse sequences of the brain and surrounding structures were obtained without intravenous contrast. COMPARISON:  Prior CT from 09/07/2017 FINDINGS: Brain: Moderately advanced cerebral atrophy with chronic small vessel ischemic disease. No abnormal foci of restricted diffusion to suggest acute or subacute ischemia. Gray-white matter differentiation maintained. No evidence for remote cortical infarction. Trace chronic subdural hemorrhage overlies the left cerebral convexity. Small chronic microhemorrhage noted within the mid right centrum semi ovale. No mass lesion, midline shift  or mass effect. Ventricular prominence related to global parenchymal volume loss without hydrocephalus. Pituitary gland within normal limits. Vascular: Major intracranial vascular flow voids are maintained Skull and upper cervical spine: Degenerative thickening  at the tectorial membrane with secondary narrowing at the craniocervical junction. Mild cervical spondylolysis noted within the visualized upper cervical spine. Bone marrow signal intensity within normal limits. Prior left frontal and parietal burr hole craniotomies noted, presumably for subdural evacuation. No acute scalp soft tissue abnormality. Sinuses/Orbits: Globes and orbital soft tissues demonstrate no acute finding. Patient is status post ocular lens replacement bilaterally. Scattered mucosal thickening within the ethmoidal air cells. Paranasal sinuses are otherwise clear. No significant mastoid effusion. Other: None. IMPRESSION: 1. No acute intracranial abnormality. 2. Trace chronic subdural hematoma overlying the left cerebral convexity. 3. Moderately advanced cerebral atrophy with chronic small vessel ischemic disease. Electronically Signed   By: Jeannine Boga M.D.   On: 09/08/2017 06:40   Ct Abdomen Pelvis W Contrast  Result Date: 09/08/2017 CLINICAL DATA:  Altered mental status and fever.  Recent fall. EXAM: CT CHEST, ABDOMEN, AND PELVIS WITH CONTRAST TECHNIQUE: Multidetector CT imaging of the chest, abdomen and pelvis was performed following the standard protocol during bolus administration of intravenous contrast. CONTRAST:  19mL OMNIPAQUE IOHEXOL 300 MG/ML  SOLN COMPARISON:  Remote abdomen/pelvis CT 11/05/2007 FINDINGS: CT CHEST FINDINGS Cardiovascular: Aortic atherosclerosis without acute aortic abnormality. Heart is normal in size. There are coronary artery calcifications. Minimal fluid in the superior pericardial recess. Mediastinum/Nodes: Borderline lower paratracheal node measures 11 mm short axis, likely reactive. No  mediastinal hemorrhage or hematoma. Esophagus is decompressed. Visualized thyroid gland is normal. Lungs/Pleura: Elevated right hemidiaphragm with compressive atelectasis in the right lower and middle lobes. Peripheral subpleural reticulations throughout both lungs. No confluent consolidation. No pulmonary edema or pleural fluid. Musculoskeletal: Posterior right eighth, ninth, and tenth rib fractures have some surrounding but incomplete callus formation. Remote mild compression fractures of T1 and T3. No acute fractures. CT ABDOMEN PELVIS FINDINGS Hepatobiliary: Small subcentimeter hypodensities in the right hepatic lobe are too small to accurately characterize. No evidence of hepatic injury. Punctate granuloma in the left lobe. No calcified gallstone or gallbladder inflammation. Pancreas: Parenchymal atrophy. No ductal dilatation or inflammation. No pancreatic injury. Spleen: Normal in size without focal abnormality. No splenic injury. Adrenals/Urinary Tract: No adrenal hemorrhage or renal injury identified. No adrenal nodule. No hydronephrosis or perinephric edema. Homogeneous renal enhancement with symmetric excretion on delayed phase imaging. 18 mm cyst in the lower right kidney. Cortical scarring in the mid lower right kidney with punctate parenchymal calcification. Diffuse urinary bladder wall thickening with mild perivesicular stranding. Stomach/Bowel: Bowel evaluation is limited in the absence of enteric contrast. Stomach is nondistended. No small bowel wall thickening, inflammatory change or obstruction. Cecum appears high-riding in the right upper quadrant. No cecal wall thickening. Moderate stool in the colon with colonic redundancy. Appendix not confidently identified. No mesenteric hematoma or evidence of bowel injury. Vascular/Lymphatic: Aortic and branch atherosclerosis. No acute vascular findings. No abdominopelvic adenopathy. Reproductive: Prostatic calcifications. Other: No free air or ascites. No  intra-abdominal abscess. Minimal soft tissue density/fluid in the left inguinal canal. Musculoskeletal: Mild scoliosis and degenerative change throughout the lumbar spine. Prominent Schmorl's node versus mild remote superior endplate compression L1. No fracture of the lumbar spine or bony pelvis. IMPRESSION: 1. Mild urinary bladder wall thickening and perivesicular edema suspicious for cystitis. 2. Right posterior eighth, ninth, and tenth rib fractures have some surrounding callus which is incomplete, these may be subacute. 3. Additional ancillary nonacute findings as described. 4.  Aortic Atherosclerosis (ICD10-I70.0). Electronically Signed   By: Jeb Levering M.D.   On: 09/08/2017 00:42     ASSESSMENT AND PLAN:  84 year old male with no significant past medical history presents to the hospital due to altered mental status/confusion/delirium.  1.  Altered mental status/confusion/delirium-etiology unclear presently.  Suspected to be secondary to underlying mild cognitive decline combined with postconcussion syndrome as per neurology. -Patient has no previous history of dementia as per the family.  He was in his usual state of health as of a few days ago and presented to the hospital with hallucinations and delusion and confusion. - Urinalysis is negative for UTI, chest x-ray negative for pneumonia.  CT head, MRI of the brain shows no acute neurologic pathology. -EEG results pending -Continue supportive care and avoid sedative meds. -Continue to follow mental status. Try Seroquel at bedtime  2.  Hypokalemia- replaced place  3.  Generalized weakness/recurrent falls- will await physical therapy evaluation to assess mobility status.  Patient may possibly need placement to short-term rehab/assisted living.   All the records are reviewed and case discussed with Care Management/Social Worker. Management plans discussed with the patient, family and they are in agreement.  CODE STATUS: DNR DVT  Prophylaxis: Lovenox  TOTAL TIME TAKING CARE OF THIS PATIENT: 30 minutes.   POSSIBLE D/C IN 1-2 DAYS, DEPENDING ON CLINICAL CONDITION.   Dustin Flock M.D on 09/09/2017 at 2:50 PM  Between 7am to 6pm - Pager - [phone]  After 6pm go to [email or web address] - Technical brewer [facility]  Office  [phone]  CC: Primary care physician; Kirk Ruths, MD

## 2017-09-09 NOTE — Care Management (Signed)
Late entry  RNCM met with patient, son, daughter in law, and granddaughter on 09/08/17.  Patient lives at home alone.  PCP Ouida Sills.  At baseline patient lives alone, and does not use assistive devices.  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  Closest family lives in [place].  Sons provide transportation when needed.  PT was pending at the time of assessment.  Family is requesting placement for patient.  RNCM explained that patient will have to be able to participate with PT, and SNF level of care would have to bee recommend in order for insurance to cover SNF level of care.  Family questions "what happens if he can't, he won't be able to return home like this".  RNCM informed family that if that is the case they would have to consider paying out of pocket for facility, paying out of pocket for 24/7 care in the home, or family would have to provide round the clock care.  RNCM also discussed home health services, and what they are able to provided  7/10 - PT has been unable to work with patient, due to him not being able to follow commands

## 2017-09-09 NOTE — Progress Notes (Signed)
Advanced care plan.  Purpose of the Encounter: CODE STATUS  Parties in Attendance: Patient himself son  Patient's Decision Capacity: Not intact  Subjective/Patient's story: Patient is a 84 year old with dementia with falls admitted with altered mental status   Objective/Medical story I discussed with patient and his son regarding his desires for resuscitation   Goals of care determination:  DNR   CODE STATUS: DNR   Time spent discussing advanced care planning: 16 minutes

## 2017-09-09 NOTE — Progress Notes (Signed)
PT Cancellation Note  Patient Details Name: Danny James MRN: [id number] DOB: 06/20/1920   Cancelled Treatment:    Reason Eval/Treat Not Completed: Other (comment).  Pt eating lunch and requests that PT return at a later time.  Will attempt to see pt again later today, schedule permitting.    Collie Siad PT, DPT 09/09/2017, 1:28 PM

## 2017-09-09 NOTE — Procedures (Signed)
ELECTROENCEPHALOGRAM REPORT   Patient: Danny James       Room #: 225A-AA EEG No. ID: [id number] Age: 84 y.o.        Sex: male Referring Physician: Posey Pronto Report Date:  09/09/2017        Interpreting Physician: Alexis Goodell  History: Danny James is an 84 y.o. male with altered mental status  Medications:  Seroquel  Conditions of Recording:  This is a 16 channel EEG carried out with the patient in the awake and drowsy states.  Description:  The patient appears in drowse for the majority of the recording and was only able to be alerted rarely to achieve a posterior background rhythm of 8Hz  alpha.  On most occasions the posterior background rhythm was slower at 6-7Hz  theta.  This slower posterior background rhythm appears to either precede drowse or be incorporated in drowse when the background is otherwise slow as well consisting of irregular, low voltage theta and beta activity.   Stage II sleep is not obtained.  Hyperventilation was not performed. Intermittent photic stimulation was performed but failed to illicit any change in the tracing.     IMPRESSION: This EEG is characterized by slowing which is consistent with normal drowse.  Can not rule out the possibility of slowing related to general cerebral disturbance such as a dementia or metabolic encephalopathy.  Clinical correlation recommended.  No epileptiform activity is noted.       Alexis Goodell, MD Neurology [phone] 09/09/2017, 1:56 PM

## 2017-09-09 NOTE — Plan of Care (Signed)

## 2017-09-09 NOTE — Clinical Social Work Note (Signed)
Clinical Social Work Assessment  Patient Details  Name: Danny James MRN: [id number] Date of Birth: 07-Apr-1920  Date of referral:  09/09/17               Reason for consult:  Discharge Planning                Permission sought to share information with:    Permission granted to share information::     Name::        Agency::     Relationship::     Contact Information:     Housing/Transportation Living arrangements for the past 2 months:  Single Family Home Source of Information:  Adult Children Patient Interpreter Needed:  None Criminal Activity/Legal Involvement Pertinent to Current Situation/Hospitalization:  No - Comment as needed Significant Relationships:  Adult Children Lives with:  Self Do you feel safe going back to the place where you live?  No Need for family participation in patient care:  Yes (Comment)  Care giving concerns:  Patient has been residing at home alone.   Social Worker assessment / plan:  Patient's son (who has a twin) and his wife were in patient's room at time of CSW visit. CSW spent much time with patient's son and daughter in law. One of patient's sons has had a bad accident a few months ago and needed to have surgery, the other son's wife is bedbound with leukemia. They have been rotating coming to see patient once a week and taking him to appointments and groceries, etc. Patient's son reports that his plan for patient was to get patient into an assisted living in [place] but stated that his dad had refused on a regular basis. Patient's son now states that he is still wanting assisted living. CSW explained that patient is not appropriate for assisted living at this time and that unfortunately he is not able to participate with physical therapy and thus insurance will not cover for him to go to short term rehab. CSW explained that the family needs to come together and discuss what they are capable of doing because at this time, patient could be placed in a  nursing home but it would be for custodial care and would need to be paid for out of pocket. Patient's son and daughter in law verbalized understanding.  Employment status:    Nurse, adult PT Recommendations:  (pt unable to participate or follow directions) Information / Referral to community resources:     Patient/Family's Response to care:  Patient's son and wife expressed appreciation for CSW assistance.  Patient/Family's Understanding of and Emotional Response to Diagnosis, Current Treatment, and Prognosis: Patient's son is frustrated and the limited options for a discharge disposition.   Emotional Assessment Appearance:  Appears stated age Attitude/Demeanor/Rapport:  (pleasant and cooperative) Affect (typically observed):    Orientation:  Oriented to Self Alcohol / Substance use:  Not Applicable Psych involvement (Current and /or in the community):  No (Comment)  Discharge Needs  Concerns to be addressed:  Care Coordination Readmission within the last 30 days:  No Current discharge risk:  None Barriers to Discharge:  Continued Medical Work up   [employer], [place] 09/09/2017, 5:08 PM

## 2017-09-09 NOTE — Progress Notes (Signed)
PT Cancellation Note  Patient Details Name: Danny James MRN: [id number] DOB: [birth year]   Cancelled Treatment:    Reason Eval/Treat Not Completed: Other (comment).  Upon attempt to see pt for PT evaluation, pt in bed and significantly gripping Bil bed rails.  PT restless and reports being very hot.  Pulse ox reading pulse at 90 bpm.  Pt oriented to last name only.  Pt unable to follow commands.  Will hold PT until pt able to participate in PT evaluation.     Collie Siad PT, DPT 09/09/2017, 3:19 PM

## 2017-09-10 MED ORDER — LORAZEPAM 2 MG/ML IJ SOLN: 0.5000 mg | Freq: Two times a day (BID) | INTRAMUSCULAR | Status: DC | PRN

## 2017-09-10 NOTE — Clinical Social Work Note (Signed)
Physical therapy informed CSW that patient was somewhat able to participate in therapy today and that they would be recommending short term rehab. CSW spoke with patient's son this morning and discussed being able to do bedsearch. Patient's son has chosen [facility] and [facility] has obtained authorization for patient to come there. MD keeping patient one more day. Shela Leff MSW,LCSW [phone]

## 2017-09-10 NOTE — Evaluation (Signed)
Physical Therapy Evaluation Patient Details Name: Danny James MRN: [id number] DOB: 1921/02/21 Today's Date: 09/10/2017   History of Present Illness  presented to ER secondary to generalized weakness, AMS; admitted with acute metabolic encephalopathy of unknown origin.  Clinical Impression  Patient sleeping upon arrival to room, but awakens to voice/light touch.  Does follow simple commands with increased time for processing, task initiation.  Globally weak and deconditioned throughout all extremities; makes effort to mobilize all against gravity when cuing.  Currently requiring mod/max assist +1-2 for bed mobility; min/mod assist for unsupported sitting balance; max assist +2 with RW for partial sit/stand.  Very fearful of falling, requiring constant (and heavy) hands on assist for lift off, forward weight shift and overall standing balance. Unsafe/unable to attempt gait/mobility beyond edge of bed. Would benefit from skilled PT to address above deficits and promote optimal return to PLOF; recommend transition to STR upon discharge from acute hospitalization.     Follow Up Recommendations SNF    Equipment Recommendations       Recommendations for Other Services       Precautions / Restrictions Precautions Precautions: Fall Restrictions Weight Bearing Restrictions: No      Mobility  Bed Mobility Overal bed mobility: Needs Assistance Bed Mobility: Supine to Sit;Sit to Supine     Supine to sit: Mod assist;Max assist Sit to supine: Max assist;Total assist;+2 for physical assistance      Transfers Overall transfer level: Needs assistance Equipment used: Rolling walker (2 wheeled) Transfers: Sit to/from Stand Sit to Stand: Max assist;+2 physical assistance         General transfer comment: extensive assist for UE/LE placement, forward weight shift, lift off and static standing balance. Unable to achieve full upright posture, but does clear buttocks from bed surface. Heavy  posterior trunk lean/weight shift; very fearful of falling  Ambulation/Gait             General Gait Details: unsafe/unable  Stairs            Wheelchair Mobility    Modified Rankin (Stroke Patients Only)       Balance Overall balance assessment: Needs assistance Sitting-balance support: No upper extremity supported;Feet supported Sitting balance-Leahy Scale: Fair Sitting balance - Comments: R posterior/lateral lean with unsupported sitting; limited awareness of sway and need for balance correction   Standing balance support: Bilateral upper extremity supported Standing balance-Leahy Scale: Zero Standing balance comment: unable to acheive full upright stance, absent midline awareness or balance correction; very heavy posterior trunk lean                             Pertinent Vitals/Pain Pain Assessment: Faces Faces Pain Scale: No hurt    Home Living Family/patient expects to be discharged to:: Private residence Living Arrangements: Alone Available Help at Discharge: Family(daily phone calls, weekly assist for groceries from sons) Type of Home: House       Home Layout: One level Home Equipment: Kasandra Knudsen - single point      Prior Function Level of Independence: Independent with assistive device(s)         Comments: Mod indep with SPC for ADLs, household distances; sons assist with community needs.  Family does endorse multiple fall history (unable to fully quantify)     Hand Dominance        Extremity/Trunk Assessment   Upper Extremity Assessment Upper Extremity Assessment: Generalized weakness    Lower Extremity Assessment Lower Extremity Assessment:  Generalized weakness(grossly 3-/5 throughout)       Communication   Communication: (speech slightly mumbled at times)  Cognition Arousal/Alertness: Lethargic Behavior During Therapy: WFL for tasks assessed/performed Overall Cognitive Status: Impaired/Different from baseline                                  General Comments: oriented to self, location; does follow simple, one-step commands; increased time required for processing and task initiation      General Comments      Exercises Other Exercises Other Exercises: Unsupported sitting, worked to promote anterior weight shift/forward trunk lean, mod assist from therapist Other Exercises: Sit/stand attempts x3 with RW, max assist +2-unable to achieve full postural extension and upright stance   Assessment/Plan    PT Assessment Patient needs continued PT services  PT Problem List Decreased strength;Decreased range of motion;Decreased balance;Decreased mobility;Decreased coordination;Decreased cognition;Decreased knowledge of use of DME;Decreased activity tolerance;Decreased safety awareness;Decreased knowledge of precautions;Decreased skin integrity       PT Treatment Interventions DME instruction;Gait training;Functional mobility training;Therapeutic activities;Therapeutic exercise;Balance training;Patient/family education    PT Goals (Current goals can be found in the Care Plan section)  Acute Rehab PT Goals Patient Stated Goal: per family, to consider transition to STR if able PT Goal Formulation: With family Time For Goal Achievement: 09/24/17 Potential to Achieve Goals: Fair    Frequency Min 2X/week   Barriers to discharge Decreased caregiver support      Co-evaluation               AM-PAC PT "6 Clicks" Daily Activity  Outcome Measure Difficulty turning over in bed (including adjusting bedclothes, sheets and blankets)?: Unable Difficulty moving from lying on back to sitting on the side of the bed? : Unable Difficulty sitting down on and standing up from a chair with arms (e.g., wheelchair, bedside commode, etc,.)?: Unable Help needed moving to and from a bed to chair (including a wheelchair)?: Total Help needed walking in hospital room?: Total Help needed climbing 3-5 steps with a  railing? : Total 6 Click Score: 6    End of Session Equipment Utilized During Treatment: Gait belt Activity Tolerance: Patient tolerated treatment well Patient left: with bed alarm set;in bed;with call bell/phone within reach;with family/visitor present Nurse Communication: Mobility status PT Visit Diagnosis: Unsteadiness on feet (R26.81);Repeated falls (R29.6);Muscle weakness (generalized) (M62.81);Difficulty in walking, not elsewhere classified (R26.2)    Time: 9509-3267 PT Time Calculation (min) (ACUTE ONLY): 25 min   Charges:   PT Evaluation $PT Eval Moderate Complexity: 1 Mod PT Treatments $Therapeutic Activity: 8-22 mins   PT G Codes:       Paxten Appelt H. Owens Shark, PT, DPT, NCS 09/10/17, 1:50 PM [phone]

## 2017-09-10 NOTE — Progress Notes (Signed)
[place] [facility] [place] NAME: Greogory Cornette    MR#:  [id number]  DATE OF BIRTH:  Feb 21, 1921  SUBJECTIVE:   Patient at the time of my evaluation was very drowsy.  We were able to wake him up but he goes back to sleep.  REVIEW OF SYSTEMS:    Review of Systems  Unable to perform ROS: Mental acuity    Nutrition: Regular Tolerating Diet: yes Tolerating PT: Await Eval.   DRUG ALLERGIES:  No Known Allergies  VITALS:  Blood pressure 121/72, pulse 77, temperature 98.3 F (36.8 C), temperature source Oral, resp. rate 19, height 6' (1.829 m), weight 82 kg (180 lb 12.4 oz), SpO2 97 %.  PHYSICAL EXAMINATION:   Physical Exam  GENERAL:  84 y.o.-year-old patient lying in bed with mitts on somewhat confused.  EYES: Pupils equal, round, reactive to light and accommodation. No scleral icterus. Extraocular muscles intact.  HEENT: Head atraumatic, normocephalic. Oropharynx and nasopharynx clear. Dry oral Mucosa. Bruise on the right side of head from recent fall.   NECK:  Supple, no jugular venous distention. No thyroid enlargement, no tenderness.  LUNGS: Occasional crackles at the base CARDIOVASCULAR: S1, S2 normal. No murmurs, rubs, or gallops.  ABDOMEN: Soft, nontender, nondistended. Bowel sounds present. No organomegaly or mass.  EXTREMITIES: No cyanosis, clubbing or edema b/l.    NEUROLOGIC: Cranial nerves II through XII are intact. No focal Motor or sensory deficits b/l.  Globally weak PSYCHIATRIC: The patient is alert and oriented x 1.  SKIN: No obvious rash, lesion, or ulcer. Skin tear on Right elbow.    LABORATORY PANEL:   CBC Recent Labs  Lab 09/09/17 0743  WBC 8.8  HGB 14.0  HCT 39.7*  PLT 194   ------------------------------------------------------------------------------------------------------------------  Chemistries  Recent Labs  Lab 09/07/17 2057 09/08/17 0126  09/09/17 0743  NA 140  --    < > 137  K 3.4*  --    < > 4.1   CL 110  --    < > 106  CO2 23  --    < > 22  GLUCOSE 87  --    < > 86  BUN 19  --    < > 12  CREATININE 0.74  --    < > 0.76  CALCIUM 8.3*  --    < > 8.1*  MG  --  1.9  --   --   AST 33  --   --   --   ALT 17  --   --   --   ALKPHOS 53  --   --   --   BILITOT 1.2  --   --   --    < > = values in this interval not displayed.   ------------------------------------------------------------------------------------------------------------------  Cardiac Enzymes Recent Labs  Lab 09/07/17 2057  TROPONINI <0.03   ------------------------------------------------------------------------------------------------------------------  RADIOLOGY:  No results found.   ASSESSMENT AND PLAN:   84 year old male with no significant past medical history presents to the hospital due to altered mental status/confusion/delirium.  1.  Altered mental status/confusion/delirium-etiology unclear presently.  Suspected to be secondary to underlying mild cognitive decline combined with postconcussion syndrome as per neurology. -Patient has no previous history of dementia as per the family.  He was in his usual state of health as of a few days ago and presented to the hospital with hallucinations and delusion and confusion. - Urinalysis is negative for UTI, chest x-ray negative for pneumonia.  CT head, MRI of the brain shows no acute neurologic pathology. -Continue supportive care and avoid sedative meds. -Continue to follow mental status. I will decrease his Ativan dose  2.  Hypokalemia- replaced place  3.  Generalized weakness/recurrent falls- will need rehab placement.   All the records are reviewed and case discussed with Care Management/Social Worker. Management plans discussed with the patient, family and they are in agreement.  CODE STATUS: DNR DVT Prophylaxis: Lovenox  TOTAL TIME TAKING CARE OF THIS PATIENT: 30 minutes.   POSSIBLE D/C IN 1-2 DAYS, DEPENDING ON CLINICAL  CONDITION.   Dustin Flock M.D on 09/10/2017 at 2:59 PM  Between 7am to 6pm - Pager - [phone]  After 6pm go to [email or web address] - [employer]wer [facility]  Office  [phone]  CC: Primary care physician; Kirk Ruths, MD

## 2017-09-10 NOTE — NC FL2 (Signed)
  [place] LEVEL OF CARE SCREENING TOOL     IDENTIFICATION  Patient Name: Danny James Birthdate: 1920/12/30 Sex: male Admission Date (Current Location): 09/07/2017  [facility] and [place] Number:  Engineering geologist and Address:  [facility], [address], [place], [place] [address]      Provider Number: [id number]  Attending Physician Name and Address:  Dustin Flock, MD  Relative Name and Phone Number:       Current Level of Care: Hospital Recommended Level of Care: [place] Prior Approval Number:    Date Approved/Denied:   PASRR Number:    Discharge Plan: SNF    Current Diagnoses: Patient Active Problem List   Diagnosis Date Noted  . Altered mental status 09/08/2017    Orientation RESPIRATION BLADDER Height & Weight     Self  Normal Incontinent Weight: 180 lb 12.4 oz (82 kg) Height:  6' (182.9 cm)  BEHAVIORAL SYMPTOMS/MOOD NEUROLOGICAL BOWEL NUTRITION STATUS  (none) (none) Incontinent Diet(regular)  AMBULATORY STATUS COMMUNICATION OF NEEDS Skin   Extensive Assist Verbally Normal                       Personal Care Assistance Level of Assistance  Dressing, Bathing, Feeding Bathing Assistance: Maximum assistance Feeding assistance: Limited assistance Dressing Assistance: Maximum assistance Total Care Assistance: Maximum assistance   Functional Limitations Info  Hearing   Hearing Info: Impaired      SPECIAL CARE FACTORS FREQUENCY  PT (By licensed PT)                    Contractures Contractures Info: Not present    Additional Factors Info  Code Status Code Status Info: dnr             Current Medications (09/10/2017):  This is the current hospital active medication list Current Facility-Administered Medications  Medication Dose Route Frequency Provider Last Rate Last Dose  . acetaminophen (TYLENOL) tablet 650 mg  650 mg Oral Q6H PRN Arta Silence, MD       Or  .  acetaminophen (TYLENOL) suppository 650 mg  650 mg Rectal Q6H PRN Arta Silence, MD      . bisacodyl (DULCOLAX) EC tablet 5 mg  5 mg Oral Daily PRN Arta Silence, MD      . enoxaparin (LOVENOX) injection 40 mg  40 mg Subcutaneous Q24H Arta Silence, MD   40 mg at 09/09/17 2213  . LORazepam (ATIVAN) injection 1 mg  1 mg Intravenous Q4H PRN Dustin Flock, MD   1 mg at 09/09/17 1546  . ondansetron (ZOFRAN) tablet 4 mg  4 mg Oral Q6H PRN Arta Silence, MD       Or  . ondansetron (ZOFRAN) injection 4 mg  4 mg Intravenous Q6H PRN Arta Silence, MD      . QUEtiapine (SEROQUEL) tablet 50 mg  50 mg Oral QHS Dustin Flock, MD      . senna-docusate (Senokot-S) tablet 1 tablet  1 tablet Oral QHS PRN Arta Silence, MD         Discharge Medications: Please see discharge summary for a list of discharge medications.  Relevant Imaging Results:  Relevant Lab Results:   Additional Information ss: [id number]  Shela Leff, LCSW

## 2017-09-11 MED ORDER — ACETAMINOPHEN 325 MG PO TABS: 650.0000 mg | ORAL_TABLET | Freq: Four times a day (QID) | ORAL | Status: AC | PRN

## 2017-09-11 MED ORDER — LORAZEPAM 0.5 MG PO TABS: 0.5000 mg | ORAL_TABLET | Freq: Three times a day (TID) | ORAL | 0 refills | Status: AC | PRN

## 2017-09-11 NOTE — Progress Notes (Signed)
Report called to [place] RN at [facility]. Questions asked and answered. IV removed, tip  Intact, bleeding controlled. EMS called to transport. Family at bedside aware of plan. DNR and discharge packet in envelope on pt chart with script.

## 2017-09-11 NOTE — Progress Notes (Signed)
Pt resting comfortably. EMS at the bedside at this time. NAD. Pt to be transported to [facility].

## 2017-09-11 NOTE — Discharge Summary (Signed)
Danny James at [facility], [place] y.o., DOB 05-19-1920, MRN [id number]. Admission date: 09/07/2017 Discharge Date 09/11/2017 Primary MD Kirk Ruths, MD Admitting Physician Arta Silence, MD  Admission Diagnosis  Multiple skin tears [T14.8XXA] Fall, initial encounter [id number] Contusion of left temporofrontal scalp, initial encounter [S00.03XA] Altered mental status, unspecified altered mental status type [R41.82]  Discharge Diagnosis   Active Problems: Postconcussion syndrome Acute delirium Hypokalemia Generalized weakness     Hospital Course  Danny James  is a 84 y.o. 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  Patient was trying to trach his tracks out and slipped and fell.  Hit his head.  He was evaluated in the ER and admitted for altered mental status.  Patient CT scan of the head was negative.  His MRI was negative as well.  EEG showed flow waves but no seizure activity.  His symptoms were felt to be due to concussion with his advanced age.  Patient's mental status is significantly improved.  He is very weak in need of rehab.              Consults  neurology  Significant Tests:  See full reports for all details     Dg Chest 2 View  Result Date: 09/07/2017 CLINICAL DATA:  Altered mental status, fever and possible urinary tract infection. EXAM: CHEST - 2 VIEW COMPARISON:  CXR 07/31/2008 FINDINGS: Colonic interposition over the liver shadow with elevated right hemidiaphragm is noted. There is bibasilar subsegmental atelectasis with low lung volumes. No overt pulmonary edema. Posterior costophrenic angle pulmonary opacities likely reflect atelectatic change. Superimposed pneumonia would be difficult to entirely exclude as seen on the lateral view. No  acute osseous appearing abnormality. Tortuous atherosclerotic aorta. Heart size is within normal limits. IMPRESSION: 1. Elevated right hemidiaphragm with colonic interposition over the liver shadow. 2. Bibasilar atelectasis. Superimposed pneumonia projecting over the posterior costophrenic angle is not excluded given somewhat more confluent opacity seen on the lateral view. 3. Aortic atherosclerosis without aneurysm. Electronically Signed   By: Ashley Royalty M.D.   On: 09/07/2017 21:24   Ct Head Wo Contrast  Result Date: 09/08/2017 CLINICAL DATA:  Head trauma with headache EXAM: CT HEAD WITHOUT CONTRAST CT CERVICAL SPINE WITHOUT CONTRAST TECHNIQUE: Multidetector CT imaging of the head and cervical spine was performed following the standard protocol without intravenous contrast. Multiplanar CT image reconstructions of the cervical spine were also generated. COMPARISON:  05/04/2017 head CT FINDINGS: CT HEAD FINDINGS Brain: No evidence of acute infarction, hemorrhage, hydrocephalus, or mass lesion. Subdural collection along the left frontal convexity has become CSF density. Maximal residual thickness is 11 mm along the frontal operculum with mild cortical mass effect. Stable asymmetric enlargement of CSF left lateral of the cerebellum which could be hygroma or expanded subarachnoid space. There is chronic small vessel ischemia in the cerebral white matter with confluent low-density. Chronic midline shift towards the right. Vascular: Atherosclerosis. Skull: 2 left-sided calvarial burr holes, presumably for subdural evacuation. Sinuses/Orbits: Negative CT CERVICAL SPINE FINDINGS Alignment: No traumatic malalignment. Facet mediated C7-T1 and T1-2 mild anterolisthesis. Skull base and vertebrae: Remote T1 and T3 compression fractures. No acute fracture. Soft tissues and spinal canal: No prevertebral fluid or swelling. No visible canal hematoma. Disc levels: Advanced diffuse disc and facet degeneration. Atlantooccipital  degeneration with greater spurring on the right.  Ligamentous thickening behind the dens with moderate spinal stenosis. Diffuse foraminal narrowing. Upper chest: No acute finding IMPRESSION: 1. No evidence of acute intracranial or cervical spine injury. 2. Remote subdural hematoma along the left cerebral convexity. 3. Chronic small vessel ischemia. 4. Advanced spinal degeneration as described. Electronically Signed   By: Monte Fantasia M.D.   On: 09/08/2017 00:18   Ct Chest W Contrast  Result Date: 09/08/2017 CLINICAL DATA:  Altered mental status and fever.  Recent fall. EXAM: CT CHEST, ABDOMEN, AND PELVIS WITH CONTRAST TECHNIQUE: Multidetector CT imaging of the chest, abdomen and pelvis was performed following the standard protocol during bolus administration of intravenous contrast. CONTRAST:  140mL OMNIPAQUE IOHEXOL 300 MG/ML  SOLN COMPARISON:  Remote abdomen/pelvis CT 11/05/2007 FINDINGS: CT CHEST FINDINGS Cardiovascular: Aortic atherosclerosis without acute aortic abnormality. Heart is normal in size. There are coronary artery calcifications. Minimal fluid in the superior pericardial recess. Mediastinum/Nodes: Borderline lower paratracheal node measures 11 mm short axis, likely reactive. No mediastinal hemorrhage or hematoma. Esophagus is decompressed. Visualized thyroid gland is normal. Lungs/Pleura: Elevated right hemidiaphragm with compressive atelectasis in the right lower and middle lobes. Peripheral subpleural reticulations throughout both lungs. No confluent consolidation. No pulmonary edema or pleural fluid. Musculoskeletal: Posterior right eighth, ninth, and tenth rib fractures have some surrounding but incomplete callus formation. Remote mild compression fractures of T1 and T3. No acute fractures. CT ABDOMEN PELVIS FINDINGS Hepatobiliary: Small subcentimeter hypodensities in the right hepatic lobe are too small to accurately characterize. No evidence of hepatic injury. Punctate granuloma in the  left lobe. No calcified gallstone or gallbladder inflammation. Pancreas: Parenchymal atrophy. No ductal dilatation or inflammation. No pancreatic injury. Spleen: Normal in size without focal abnormality. No splenic injury. Adrenals/Urinary Tract: No adrenal hemorrhage or renal injury identified. No adrenal nodule. No hydronephrosis or perinephric edema. Homogeneous renal enhancement with symmetric excretion on delayed phase imaging. 18 mm cyst in the lower right kidney. Cortical scarring in the mid lower right kidney with punctate parenchymal calcification. Diffuse urinary bladder wall thickening with mild perivesicular stranding. Stomach/Bowel: Bowel evaluation is limited in the absence of enteric contrast. Stomach is nondistended. No small bowel wall thickening, inflammatory change or obstruction. Cecum appears high-riding in the right upper quadrant. No cecal wall thickening. Moderate stool in the colon with colonic redundancy. Appendix not confidently identified. No mesenteric hematoma or evidence of bowel injury. Vascular/Lymphatic: Aortic and branch atherosclerosis. No acute vascular findings. No abdominopelvic adenopathy. Reproductive: Prostatic calcifications. Other: No free air or ascites. No intra-abdominal abscess. Minimal soft tissue density/fluid in the left inguinal canal. Musculoskeletal: Mild scoliosis and degenerative change throughout the lumbar spine. Prominent Schmorl's node versus mild remote superior endplate compression L1. No fracture of the lumbar spine or bony pelvis. IMPRESSION: 1. Mild urinary bladder wall thickening and perivesicular edema suspicious for cystitis. 2. Right posterior eighth, ninth, and tenth rib fractures have some surrounding callus which is incomplete, these may be subacute. 3. Additional ancillary nonacute findings as described. 4.  Aortic Atherosclerosis (ICD10-I70.0). Electronically Signed   By: Jeb Levering M.D.   On: 09/08/2017 00:42   Ct Cervical Spine Wo  Contrast  Result Date: 09/08/2017 CLINICAL DATA:  Head trauma with headache EXAM: CT HEAD WITHOUT CONTRAST CT CERVICAL SPINE WITHOUT CONTRAST TECHNIQUE: Multidetector CT imaging of the head and cervical spine was performed following the standard protocol without intravenous contrast. Multiplanar CT image reconstructions of the cervical spine were also generated. COMPARISON:  05/04/2017 head CT FINDINGS: CT HEAD FINDINGS Brain: No evidence of acute  infarction, hemorrhage, hydrocephalus, or mass lesion. Subdural collection along the left frontal convexity has become CSF density. Maximal residual thickness is 11 mm along the frontal operculum with mild cortical mass effect. Stable asymmetric enlargement of CSF left lateral of the cerebellum which could be hygroma or expanded subarachnoid space. There is chronic small vessel ischemia in the cerebral white matter with confluent low-density. Chronic midline shift towards the right. Vascular: Atherosclerosis. Skull: 2 left-sided calvarial burr holes, presumably for subdural evacuation. Sinuses/Orbits: Negative CT CERVICAL SPINE FINDINGS Alignment: No traumatic malalignment. Facet mediated C7-T1 and T1-2 mild anterolisthesis. Skull base and vertebrae: Remote T1 and T3 compression fractures. No acute fracture. Soft tissues and spinal canal: No prevertebral fluid or swelling. No visible canal hematoma. Disc levels: Advanced diffuse disc and facet degeneration. Atlantooccipital degeneration with greater spurring on the right. Ligamentous thickening behind the dens with moderate spinal stenosis. Diffuse foraminal narrowing. Upper chest: No acute finding IMPRESSION: 1. No evidence of acute intracranial or cervical spine injury. 2. Remote subdural hematoma along the left cerebral convexity. 3. Chronic small vessel ischemia. 4. Advanced spinal degeneration as described. Electronically Signed   By: Monte Fantasia M.D.   On: 09/08/2017 00:18   Mr Brain Wo Contrast  Result  Date: 09/08/2017 CLINICAL DATA:  Initial evaluation for acute altered mental status EXAM: MRI HEAD WITHOUT CONTRAST TECHNIQUE: Multiplanar, multiecho pulse sequences of the brain and surrounding structures were obtained without intravenous contrast. COMPARISON:  Prior CT from 09/07/2017 FINDINGS: Brain: Moderately advanced cerebral atrophy with chronic small vessel ischemic disease. No abnormal foci of restricted diffusion to suggest acute or subacute ischemia. Gray-white matter differentiation maintained. No evidence for remote cortical infarction. Trace chronic subdural hemorrhage overlies the left cerebral convexity. Small chronic microhemorrhage noted within the mid right centrum semi ovale. No mass lesion, midline shift or mass effect. Ventricular prominence related to global parenchymal volume loss without hydrocephalus. Pituitary gland within normal limits. Vascular: Major intracranial vascular flow voids are maintained Skull and upper cervical spine: Degenerative thickening at the tectorial membrane with secondary narrowing at the craniocervical junction. Mild cervical spondylolysis noted within the visualized upper cervical spine. Bone marrow signal intensity within normal limits. Prior left frontal and parietal burr hole craniotomies noted, presumably for subdural evacuation. No acute scalp soft tissue abnormality. Sinuses/Orbits: Globes and orbital soft tissues demonstrate no acute finding. Patient is status post ocular lens replacement bilaterally. Scattered mucosal thickening within the ethmoidal air cells. Paranasal sinuses are otherwise clear. No significant mastoid effusion. Other: None. IMPRESSION: 1. No acute intracranial abnormality. 2. Trace chronic subdural hematoma overlying the left cerebral convexity. 3. Moderately advanced cerebral atrophy with chronic small vessel ischemic disease. Electronically Signed   By: Jeannine Boga M.D.   On: 09/08/2017 06:40   Ct Abdomen Pelvis W  Contrast  Result Date: 09/08/2017 CLINICAL DATA:  Altered mental status and fever.  Recent fall. EXAM: CT CHEST, ABDOMEN, AND PELVIS WITH CONTRAST TECHNIQUE: Multidetector CT imaging of the chest, abdomen and pelvis was performed following the standard protocol during bolus administration of intravenous contrast. CONTRAST:  180mL OMNIPAQUE IOHEXOL 300 MG/ML  SOLN COMPARISON:  Remote abdomen/pelvis CT 11/05/2007 FINDINGS: CT CHEST FINDINGS Cardiovascular: Aortic atherosclerosis without acute aortic abnormality. Heart is normal in size. There are coronary artery calcifications. Minimal fluid in the superior pericardial recess. Mediastinum/Nodes: Borderline lower paratracheal node measures 11 mm short axis, likely reactive. No mediastinal hemorrhage or hematoma. Esophagus is decompressed. Visualized thyroid gland is normal. Lungs/Pleura: Elevated right hemidiaphragm with compressive atelectasis in the right  lower and middle lobes. Peripheral subpleural reticulations throughout both lungs. No confluent consolidation. No pulmonary edema or pleural fluid. Musculoskeletal: Posterior right eighth, ninth, and tenth rib fractures have some surrounding but incomplete callus formation. Remote mild compression fractures of T1 and T3. No acute fractures. CT ABDOMEN PELVIS FINDINGS Hepatobiliary: Small subcentimeter hypodensities in the right hepatic lobe are too small to accurately characterize. No evidence of hepatic injury. Punctate granuloma in the left lobe. No calcified gallstone or gallbladder inflammation. Pancreas: Parenchymal atrophy. No ductal dilatation or inflammation. No pancreatic injury. Spleen: Normal in size without focal abnormality. No splenic injury. Adrenals/Urinary Tract: No adrenal hemorrhage or renal injury identified. No adrenal nodule. No hydronephrosis or perinephric edema. Homogeneous renal enhancement with symmetric excretion on delayed phase imaging. 18 mm cyst in the lower right kidney. Cortical  scarring in the mid lower right kidney with punctate parenchymal calcification. Diffuse urinary bladder wall thickening with mild perivesicular stranding. Stomach/Bowel: Bowel evaluation is limited in the absence of enteric contrast. Stomach is nondistended. No small bowel wall thickening, inflammatory change or obstruction. Cecum appears high-riding in the right upper quadrant. No cecal wall thickening. Moderate stool in the colon with colonic redundancy. Appendix not confidently identified. No mesenteric hematoma or evidence of bowel injury. Vascular/Lymphatic: Aortic and branch atherosclerosis. No acute vascular findings. No abdominopelvic adenopathy. Reproductive: Prostatic calcifications. Other: No free air or ascites. No intra-abdominal abscess. Minimal soft tissue density/fluid in the left inguinal canal. Musculoskeletal: Mild scoliosis and degenerative change throughout the lumbar spine. Prominent Schmorl's node versus mild remote superior endplate compression L1. No fracture of the lumbar spine or bony pelvis. IMPRESSION: 1. Mild urinary bladder wall thickening and perivesicular edema suspicious for cystitis. 2. Right posterior eighth, ninth, and tenth rib fractures have some surrounding callus which is incomplete, these may be subacute. 3. Additional ancillary nonacute findings as described. 4.  Aortic Atherosclerosis (ICD10-I70.0). Electronically Signed   By: Jeb Levering M.D.   On: 09/08/2017 00:42       Today   Subjective:   Danny James patient more awake doing much better.  Objective:   Blood pressure (!) 92/49, pulse 88, temperature 98 F (36.7 C), temperature source Oral, resp. rate 20, height 6' (1.829 m), weight 82 kg (180 lb 12.4 oz), SpO2 96 %.  .  Intake/Output Summary (Last 24 hours) at 09/11/2017 1105 Last data filed at 09/11/2017 1043 Gross per 24 hour  Intake 480 ml  Output 0 ml  Net 480 ml    Exam VITAL SIGNS: Blood pressure (!) 92/49, pulse 88, temperature 98 F  (36.7 C), temperature source Oral, resp. rate 20, height 6' (1.829 m), weight 82 kg (180 lb 12.4 oz), SpO2 96 %.  GENERAL:  84 y.o.-year-old patient lying in the bed with no acute distress.  EYES: Pupils equal, round, reactive to light and accommodation. No scleral icterus. Extraocular muscles intact.  HEENT: Head atraumatic, normocephalic. Oropharynx and nasopharynx clear.  NECK:  Supple, no jugular venous distention. No thyroid enlargement, no tenderness.  LUNGS: Normal breath sounds bilaterally, no wheezing, rales,rhonchi or crepitation. No use of accessory muscles of respiration.  CARDIOVASCULAR: S1, S2 normal. No murmurs, rubs, or gallops.  ABDOMEN: Soft, nontender, nondistended. Bowel sounds present. No organomegaly or mass.  EXTREMITIES: No pedal edema, cyanosis, or clubbing.  NEUROLOGIC: Cranial nerves II through XII are intact. Muscle strength 5/5 in all extremities. Sensation intact. Gait not checked.  PSYCHIATRIC: The patient is alert and oriented x 3.  SKIN: No obvious rash, lesion, or ulcer.  Data Review     CBC w Diff:  Lab Results  Component Value Date   WBC 8.8 09/09/2017   HGB 14.0 09/09/2017   HCT 39.7 (L) 09/09/2017   PLT 194 09/09/2017   CMP:  Lab Results  Component Value Date   NA 137 09/09/2017   K 4.1 09/09/2017   CL 106 09/09/2017   CO2 22 09/09/2017   BUN 12 09/09/2017   CREATININE 0.76 09/09/2017   PROT 6.0 (L) 09/07/2017   ALBUMIN 3.5 09/07/2017   BILITOT 1.2 09/07/2017   ALKPHOS 53 09/07/2017   AST 33 09/07/2017   ALT 17 09/07/2017  .  Micro Results No results found for this or any previous visit (from the past 240 hour(s)).      Code Status Orders  (From admission, onward)        Start     Ordered   09/09/17 1232  Do not attempt resuscitation (DNR)  Continuous    Question Answer Comment  In the event of cardiac or respiratory ARREST Do not call a "code blue"   In the event of cardiac or respiratory ARREST Do not perform  Intubation, CPR, defibrillation or ACLS   In the event of cardiac or respiratory ARREST Use medication by any route, position, wound care, and other measures to relive pain and suffering. May use oxygen, suction and manual treatment of airway obstruction as needed for comfort.      09/09/17 1231    Code Status History    Date Active Date Inactive Code Status Order ID Comments User Context   09/08/2017 0303 09/09/2017 1231 Full Code [id number]  Arta Silence, MD Inpatient    Advance Directive Documentation     Most Recent Value  Type of Advance Directive  Healthcare Power of [place]  Pre-existing out of facility DNR order (yellow form or pink MOST form)  -  "MOST" Form in Place?  -          Contact information for after-discharge care    Destination    [facility] .   Service:  Skilled Nursing Contact information: [address] [place] [phone]              Discharge Medications   Allergies as of 09/11/2017   No Known Allergies     Medication List    TAKE these medications   acetaminophen 325 MG tablet Commonly known as:  TYLENOL Take 2 tablets (650 mg total) by mouth every 6 (six) hours as needed for mild pain (or Fever >/= 101).   LORazepam 0.5 MG tablet Commonly known as:  ATIVAN Take 1 tablet (0.5 mg total) by mouth every 8 (eight) hours as needed for anxiety.          Total Time in preparing paper work, data evaluation and todays exam - 51 minutes  Dustin Flock M.D on 09/11/2017 at 11:05 AM [place]  [phone]

## 2017-09-11 NOTE — Clinical Social Work Placement (Signed)
   CLINICAL SOCIAL WORK PLACEMENT  NOTE  Date:  09/11/2017  Patient Details  Name: Danny James MRN: [id number] Date of Birth: Feb 04, 1921  Clinical Social Work is seeking post-discharge placement for this patient at the [place] level of care (*CSW will initial, date and re-position this form in  chart as items are completed):  Yes   Patient/family provided with [place] Work Department's list of facilities offering this level of care within the geographic area requested by the patient (or if unable, by the patient's family).  Yes   Patient/family informed of their freedom to choose among providers that offer the needed level of care, that participate in Medicare, Medicaid or managed care program needed by the patient, have an available bed and are willing to accept the patient.  Yes   Patient/family informed of [facility]'s ownership interest in [facility] and [facility], as well as of the fact that they are under no obligation to receive care at these facilities.  PASRR submitted to EDS on 09/09/17     PASRR number received on 09/09/17     Existing PASRR number confirmed on       FL2 transmitted to all facilities in geographic area requested by pt/family on 09/11/17     FL2 transmitted to all facilities within larger geographic area on       Patient informed that his/her managed care company has contracts with or will negotiate with certain facilities, including the following:        Yes   Patient/family informed of bed offers received.  Patient chooses bed at (sons chose [facility])     Physician recommends and patient chooses bed at [facility])    Patient to be transferred to (SNF) on 09/11/17.  Patient to be transferred to facility by (EMS)     Patient family notified on 09/11/17 of transfer.  Name of family member notified:  (sons)     PHYSICIAN       Additional Comment:     _______________________________________________ Shela Leff, LCSW 09/11/2017, 12:30 PM

## 2017-09-11 NOTE — Clinical Social Work Note (Signed)
Patient discharging today to [facility]. [facility] at [facility] is aware and discharge information has been sent. Nurse to call report then EMS for transport. CSW spoke with patient's son who was in the patient's room and he is aware and in agreement with discharge. Shela Leff MSW,LCSW [phone]

## 2019-04-04 DEATH — deceased
